# Patient Record
Sex: Female | Born: 1990 | Race: White | Hispanic: Yes | Marital: Single
Health system: Midwestern US, Community
[De-identification: ages and names within clinical notes are randomized; demographics above are authoritative.]

## PROBLEM LIST (undated history)

## (undated) DIAGNOSIS — T7840XA Allergy, unspecified, initial encounter: Secondary | ICD-10-CM

## (undated) DIAGNOSIS — E282 Polycystic ovarian syndrome: Secondary | ICD-10-CM

## (undated) DIAGNOSIS — R0781 Pleurodynia: Secondary | ICD-10-CM

## (undated) HISTORY — PX: WISDOM TOOTH EXTRACTION: SHX21

## (undated) HISTORY — DX: Allergy, unspecified, initial encounter: T78.40XA

---
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
## 2012-04-08 IMAGING — CT CT HEAD WITHOUT CONTRAST
2 series · 16 of 30 positions shown, 20 images · non-contrast
Comparison: none

REASON FOR EXAM: head injury, feeling in a "fog"
COMMENTS:

[Series 2: without · axial · non-contrast · 0.45mm/px · z∈[-178,-52]mm · 13 of 31 slices shown, 17 images]
[im 3/31  brain]
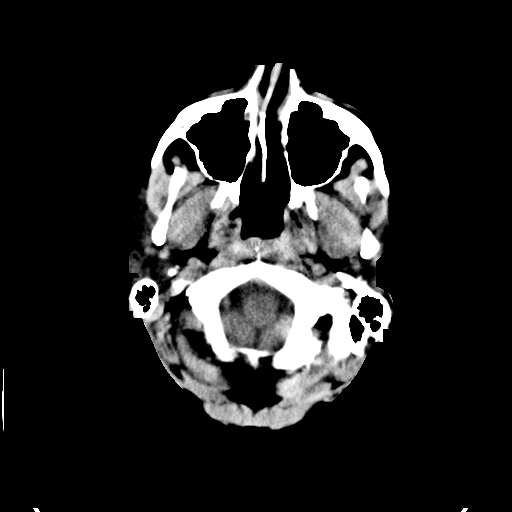
[im 3/31  bone]
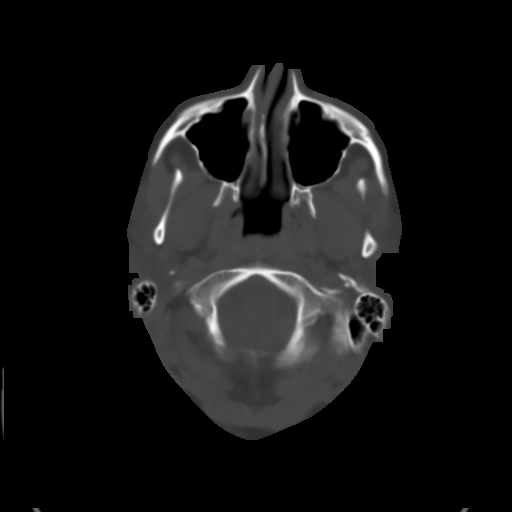
[im 5/31  brain]
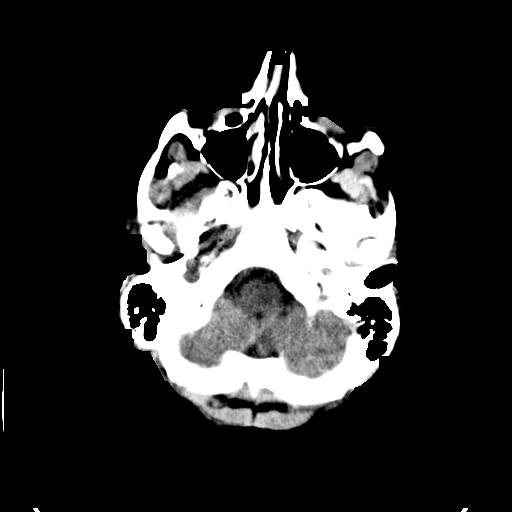
[im 7/31  brain]
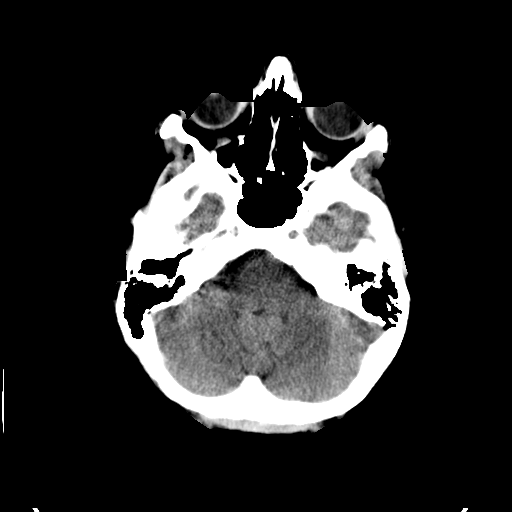
[im 9/31  brain]
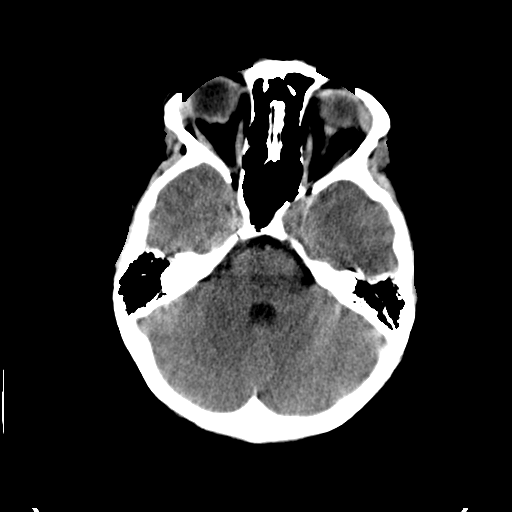
[im 11/31  brain]
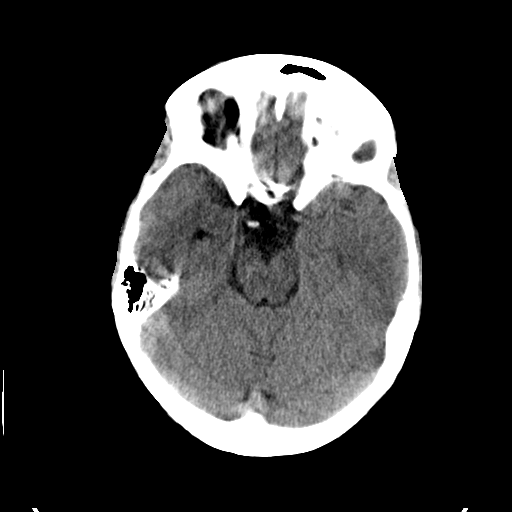
[im 11/31  bone]
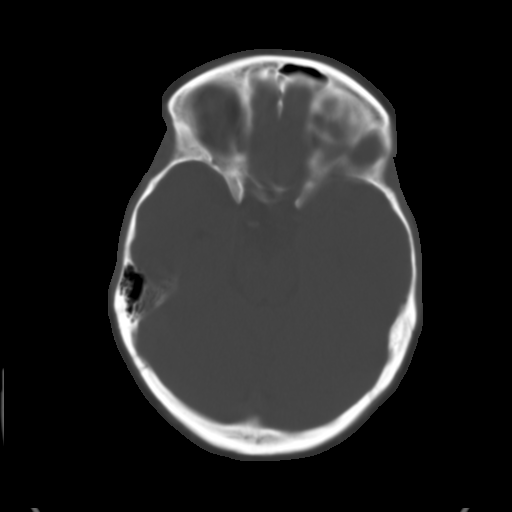
[im 13/31  brain]
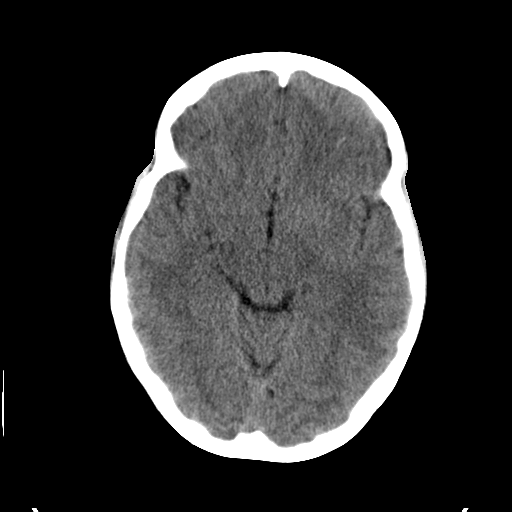
[im 16/31  brain]
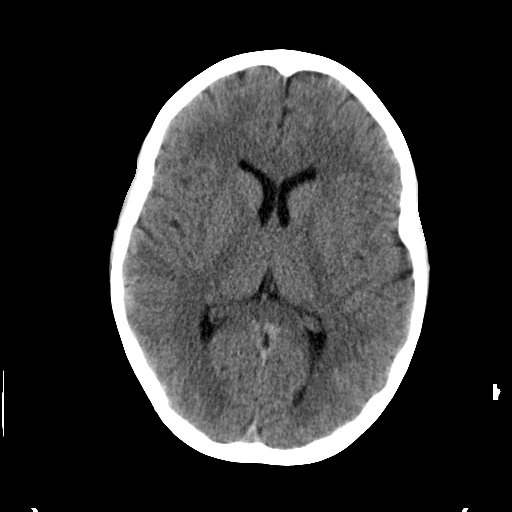
[im 18/31  brain]
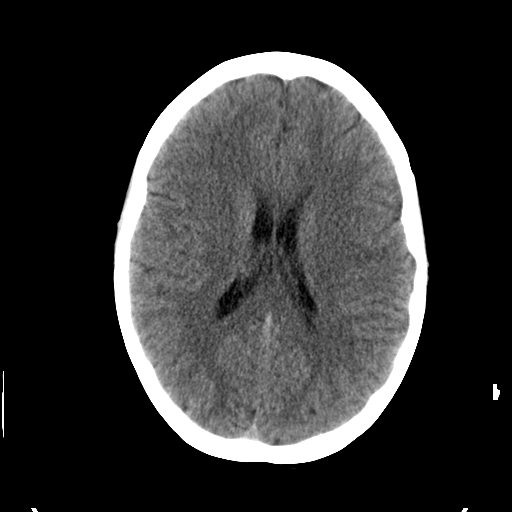
[im 20/31  brain]
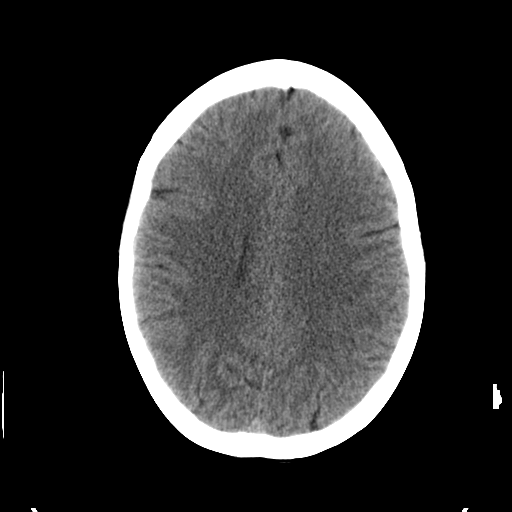
[im 20/31  bone]
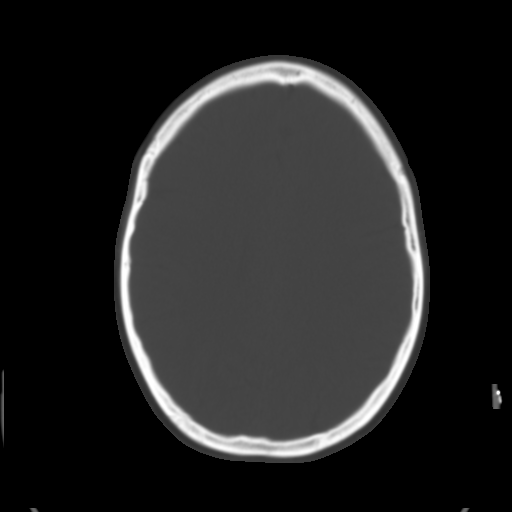
[im 22/31  brain]
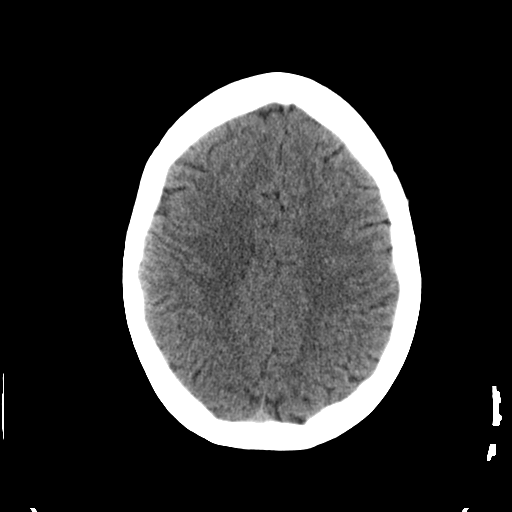
[im 24/31  brain]
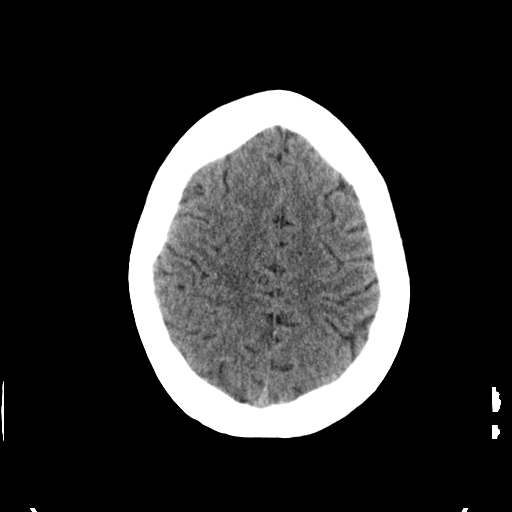
[im 26/31  brain]
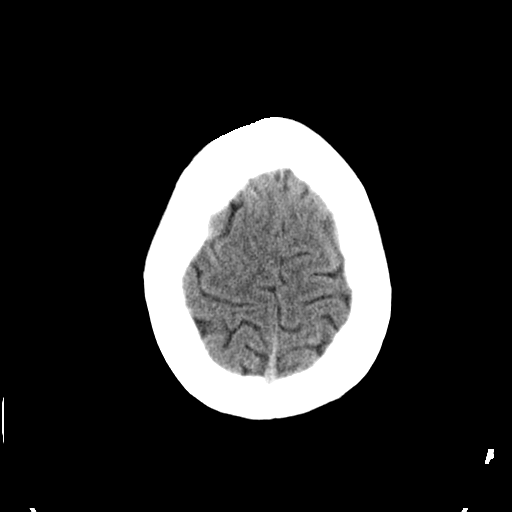
[im 28/31  brain]
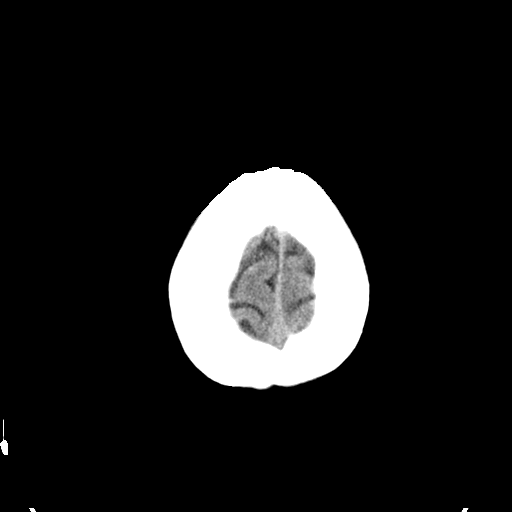
[im 28/31  bone]
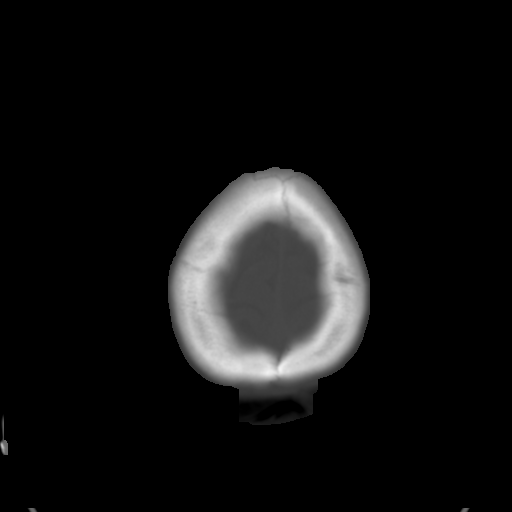

[Series 3: bone · axial · 0.45mm/px · z∈[-178,-138]mm · 3 of 31 slices shown]
[im 3/31  bone]
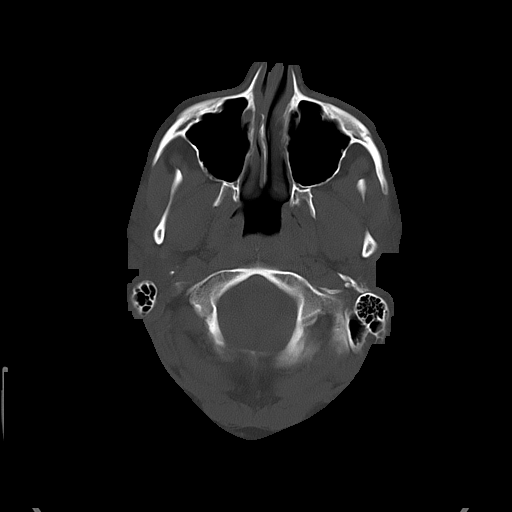
[im 7/31  bone]
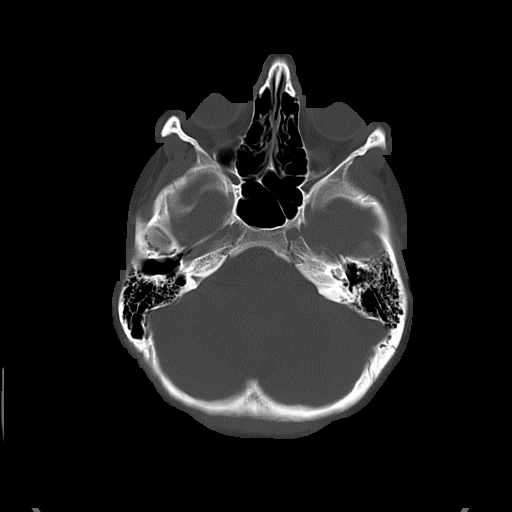
[im 11/31  bone]
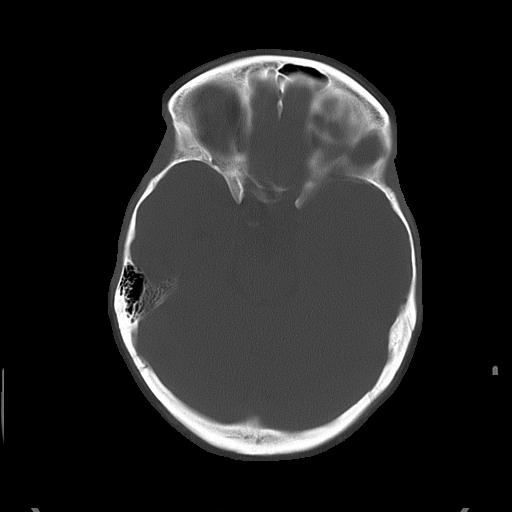

[16 of 30 positions shown; findings below may reference images not displayed]

PROCEDURE:     CT  - CT HEAD WITHOUT CONTRAST  - October 17, 2011 [DATE]

RESULT:     Emergent noncontrast CT of the brain is performed in the
standard fashion. There is no previous exam for comparison.

The ventricles and sulci are normal. There is no hemorrhage. There is no
focal mass, mass-effect or midline shift. There is no evidence of edema or
territorial infarct. The bone windows demonstrate normal aeration of the
paranasal sinuses and mastoid air cells. There is no skull fracture
demonstrated.
IMPRESSION: 1. No acute intracranial abnormality.

## 2013-06-16 LAB — EKG, 12 LEAD, INITIAL
Atrial Rate: 69 {beats}/min
Calculated P Axis: 71 degrees
Calculated R Axis: 77 degrees
Calculated T Axis: 44 degrees
Diagnosis: NORMAL
P-R Interval: 138 ms
Q-T Interval: 406 ms
QRS Duration: 90 ms
QTC Calculation (Bezet): 435 ms
Ventricular Rate: 69 {beats}/min

## 2013-06-16 NOTE — Other (Signed)
Patient is a 22 y.o. female presenting with chest pain. The history is provided by the patient.   Chest Pain   This is a new problem. The current episode started more than 2 days ago. The problem has not changed since onset.Episode frequency: intermittent- random. The pain is present in the left side (over Costochondral junction area). The pain is at a severity of 4/10. The pain is mild. The quality of the pain is described as dull. The pain does not radiate. Associated symptoms include shortness of breath (occasional). Pertinent negatives include no dizziness, no hemoptysis, no malaise/fatigue, no near-syncope and no PND. She has tried nothing for the symptoms. Risk factors include no risk factors (ecxcept physically very active- dance and exercise). Her past medical history does not include DVT or PE.        No past medical history on file.     Past Surgical History   Procedure Laterality Date   ??? Hx wisdom teeth extraction           No family history on file.     History     Social History   ??? Marital Status: SINGLE     Spouse Name: N/A     Number of Children: N/A   ??? Years of Education: N/A     Occupational History   ??? Not on file.     Social History Main Topics   ??? Smoking status: Not on file   ??? Smokeless tobacco: Not on file   ??? Alcohol Use:    ??? Drug Use:    ??? Sexually Active:      Other Topics Concern   ??? Not on file     Social History Narrative   ??? No narrative on file                ALLERGIES: Review of patient's allergies indicates no known allergies.    Filed Vitals:    06/16/13 1420   BP: 119/79   Pulse: 59   Temp: 98.4 ??F (36.9 ??C)   Resp: 18   Height: 5\' 7"  (1.702 m)   Weight: 71.215 kg (157 lb)   SpO2: 99%       Physical Exam   Nursing note and vitals reviewed.  Constitutional: No distress.   HENT:   Nose: Nose normal.   Mouth/Throat: Oropharynx is clear and moist. No oropharyngeal exudate.   Eyes: Conjunctivae and EOM are normal. Pupils are equal, round, and reactive to light. No scleral icterus.    Neck: Neck supple. No JVD present. No thyromegaly present.   Cardiovascular: Normal rate, regular rhythm, normal heart sounds and intact distal pulses.    No murmur heard.  Pulmonary/Chest: Effort normal and breath sounds normal. No respiratory distress. She has no wheezes. She has no rales. She exhibits no tenderness.   Skin: No rash noted.       MDM    Procedures

## 2013-11-21 ENCOUNTER — Ambulatory Visit: Payer: Self-pay | Admitting: Internal Medicine

## 2013-12-19 ENCOUNTER — Ambulatory Visit: Payer: Self-pay | Admitting: Internal Medicine

## 2013-12-26 ENCOUNTER — Ambulatory Visit (INDEPENDENT_AMBULATORY_CARE_PROVIDER_SITE_OTHER): Payer: BC Managed Care – PPO | Admitting: Internal Medicine

## 2013-12-26 ENCOUNTER — Encounter: Payer: Self-pay | Admitting: Internal Medicine

## 2013-12-26 VITALS — BP 106/64 | HR 75 | Temp 98.2°F | Resp 12 | Ht 67.0 in | Wt 161.0 lb

## 2013-12-26 DIAGNOSIS — E282 Polycystic ovarian syndrome: Secondary | ICD-10-CM

## 2013-12-26 NOTE — Progress Notes (Addendum)
Patient ID: Brittney Turner, female   DOB: 12-Feb-1991, 23 y.o.   MRN: [id number]  HPI: Brittney Turner is a 23 y.o. female, referred by Dr. Page Spirohristie Farrell, NP, for evaluation for PCOS. PCP: Dr Mare FerrariPatricia Ryan [facility], [place], [place] She is here with her mother, who offers part of the history.  For last 2-3 years >> weight gain: 30 lbs since starting college despite increased exercise.  She saw ObGyn and had labs for PCOS in 08/2013 (no records) >> was told she had PCOS >> she then saw another ObGyn for a second opinion Page Spiro(Christie Farrell, NP) >> they reviewed  labs and told here that they were not c/w PCOS. The labs in 08/2013 were checked after being off OCPs for 7 mo. Since the, she restarted them (see below).  She had a significant concussion 3 years ago >> could not dance and exercise for a whole summer. Her mother feels that this was when the weight gain started.  Weight gain: - since starting college - no steroid use - no weight loss meds - Meals: - Breakfast: cereal + yoghurt + fruit, sometimes bagel - Lunch: sandwich: PB or chicken/turkey - Dinner: chicken + salad + pasta - Snacks: 1-2x a day; granola bars  Drinks: none; drinks coffee - 2-3 cups a day - Diets tried: no  She was on OCPs up to 2014, when she stopped them as she could not lose the weight no cycle for 7 mo (on Koriva) >> restarted OCPs in 08/2013 ([place]). 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[employer]; also runs - trained for [employer] >> not running as much >> only 2x a week 2-3 miles Minimum amount of dance every day: 3 hours  Fertility/Menstrual cycles: - menarche at 23 y/o - irregular menses from menarche, then became heavier >> started OCPs at 23 y/o - no h/o ovarian cysts - children: 0 - miscarriages: 0 - contraception: on OCPs  - [place]; not sexually active  Acne: - worsened off the OCPs >> it has not improved after restarted  Hirsutism: - a little on upper  lip  Treatments tried: - did not try Metformin - did not try Spironolactone - did not try Vaniqua - on OCPs  Other meds: - SSRIs: no  Other medical pbs: - none  - Last thyroid tests: TSH 1.260 on 09/15/2013.  ROS: Constitutional: + weight gain, + fatigue, no subjective hyperthermia/hypothermia Eyes: no blurry vision, no xerophthalmia ENT: no sore throat, no nodules palpated in throat, no dysphagia/odynophagia, no hoarseness Cardiovascular: no CP/SOB/palpitations/leg swelling Respiratory: no cough/SOB Gastrointestinal: no N/V/D/C Musculoskeletal: no muscle/joint aches Skin: + acne, + hair on face Neurological: no tremors/numbness/tingling/dizziness Psychiatric: no depression/anxiety + irreg. Menstrual cycles  Past Medical History  Diagnosis Date  . Allergy     SEASONAL   Past Surgical History  Procedure Laterality Date  . Wisdom tooth extraction  2012   History   Social History  . Marital Status: Single    Spouse Name: N/A    Number of Children: 0   Occupational History  . Dance student   Social History Main Topics  . Smoking status: Never Smoker   . Smokeless tobacco: No  . Alcohol Use: No  . Drug Use: No   Current Outpatient Rx  Name  Route  Sig  Dispense  Refill  . desloratadine (CLARINEX) 5 MG tablet   Oral   Take 5 mg by mouth daily.         . mometasone (NASONEX)  50 MCG/ACT nasal spray   Nasal   Place 2 sprays into the nose daily.          [facility] OCP  NKDA  FH: - DM in grandmother - no FH of HTN, HL, thyroid ds.  PE: BP 106/64  Pulse 75  Temp(Src) 98.2 F (36.8 C) (Oral)  Resp 12  Ht 5\' 7"  (1.702 m)  Wt 161 lb (73.029 kg)  BMI 25.21 kg/m2  SpO2 98% Wt Readings from Last 3 Encounters:  12/26/13 161 lb (73.029 kg)   Constitutional: overweight, in NAD, no full supraclavicular fat pads Eyes: PERRLA, EOMI, no exophthalmos ENT: moist mucous membranes, no thyromegaly, no cervical lymphadenopathy Cardiovascular: RRR, No  MRG Respiratory: CTA B Gastrointestinal: abdomen soft, NT, ND, BS+ Musculoskeletal: no deformities, strength intact in all 4 Skin: moist, warm; + minimum acne on face, no dark terminal hair on face, + some vellum on sideburns, no skin tags Neurological: no tremor with outstretched hands, DTR normal in all 4  ASSESSMENT: 1. ?PCOS  PLAN: 1.  I had a long discussion with the patient and her mother about the fact that the PCOS is a misnomer, a patient does not necessarily have to have polycystic ovaries to be diagnosed with the disorder. This is of sum of several conditions, including:  weight gain  insulin resistance (and therefore a higher risk of developing diabetes later in life)  acne  hirsutism  irregular menstrual cycles  decreased fertility. - We also discussed about the fact that the treatment is usually targeted to addressing the problem that concerns the patient the most: acne/hirsutism, weight gain, or fertility, but there is no single treatment for PCOS.  - The first-line therapy are oral contraceptives. If she is concerned with her weight, we can use metformin; if she is concerned about acne/hirsutism, we can add spironolactone; and if she is concerned about fertility, I could refer her to reproductive endocrinology for possible use of clomiphene. - She does have several features of PCOS: irregular menstrual pbs, weight gain, acne. 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 At this point, since she started college, PCOS appears to predominate. - I will try to obtain labs from previous ObGyn (the labs drawn with pt off OCPs in 08/2013 >> if these are inconclusive for PCOS >> will stop OCPs and have her back for labs in 2 mo:  Hba1c  LH  FSH  Estradiol  Testosterone  Androstenedione  17 HO progesterone  TSH, free T4  and free T3 -  If the labs confirm PCOS >> start Metformin and likely also OCPs >> would like to switch to Yaz since has more anti-androgenic activity. She does not have personal or FH of DVT or clotting disorders or breast cancer. - in the meantime, we discussed about reducing calories from her diet. Given specific examples of healthy choices for her meals and also some plant-based materials (see Pt instructions). - I will see her back in 6 mo but will stay in touch through MyChart  Component     Latest Ref Rng 05/01/2014  Testosterone     10 - 70 ng/dL 58  Sex Hormone Binding     18 - 114 nmol/L 95  Testosterone Free     0.6 - 6.8 pg/mL 5.0  Testosterone-% Free     0.4 - 2.4 % 0.9  Estradiol, Free      0.18  Estradiol      12  Results received      05/08/14  Hemoglobin A1C     4.6 - 6.5 % 5.6  LH      3.56  FSH      6.2  17-OH-Progesterone, LC/MS/MS      30  DHEA-SO4     51 - 321 ug/dL 161 (H)  Androstenedione      118  TSH     0.35 - 4.50 uIU/mL 0.33 (L)  Free T4     0.60 - 1.60 ng/dL 0.96  T3, Free     2.3 - 4.2 pg/mL 2.7   Received records from [place]: 09/16/2013: TSH 1.260 Labs from previous ObGyn (Dr Arie Sabina) not sent.  [facility] [place] (Dr. Hilma Favors): 01/24/2012: patient was on Garnette Scheuermann, which was refilled. 01/24/2013: patient desired a new OCP due to increased weight of 10 pounds since starting [place] 2 years prior. This was despite being very active, and dancing. Patient  was prescribed Yaz. 08/14/2013: stopped taking Yaz in May 2014, no period since then. OB/GYN exam normal. At that point, patient was weighing 159.2 pounds, BMI 25.31.  LH was 9.5, LH was 7.8 (off OCP for 7 months). Testosterone was not checked.  Labs inconclusive for PCOS >> will stop OCPs and have her back for labs in 2 mo.   Component     Latest Ref Rng 05/19/2014  Prolactin      14.3  Somatomedin (IGF-I)     89 - 478  ng/mL 142  Cortisol, Plasma      21.8  C206 ACTH     6 - 50 pg/mL 37  DHEA-SO4     51 - 321 ug/dL 045 (H)  Pituitary labs normal. DHEAS improved.   I will consider this mild PCOS >> we can try Metformin.

## 2013-12-26 NOTE — Patient Instructions (Signed)
I will let you know if we need to check PCOS labs after I receive the records.  Please consider the following ways to cut down carbs and fat and increase fiber and micronutrients in your diet:  - substitute whole grain for white bread or pasta - substitute brown rice for white rice - substitute 90-calorie flat bread pieces for slices of bread when possible - substitute sweet potatoes or yams for white potatoes - substitute humus for margarine - substitute tofu for cheese when possible - substitute almond or rice milk for regular milk (would not drink soy milk daily due to concern for soy estrogen influence on breast cancer risk) - substitute dark chocolate for other sweets when possible - substitute water - can add lemon or orange slices for taste - for diet sodas (artificial sweeteners will trick your body that you can eat sweets without getting calories and will lead you to overeating and weight gain in the long run) - do not skip breakfast or other meals (this will slow down the metabolism and will result in more weight gain over time)  - can try smoothies made from fruit and almond/rice milk in am instead of regular breakfast - can also try old-fashioned (not instant) oatmeal made with almond/rice milk in am - order the dressing on the side when eating salad at a restaurant (pour less than half of the dressing on the salad) - eat as little meat as possible - can try juicing, but should not forget that juicing will get rid of the fiber, so would alternate with eating raw veg./fruits or drinking smoothies - use as little oil as possible, even when using olive oil - can dress a salad with a mix of balsamic vinegar and lemon juice, for e.g. - use agave nectar, stevia sugar, or regular sugar rather than artificial sweateners - steam or broil/roast veggies  - snack on veggies/fruit/nuts (unsalted, preferably) when possible, rather than processed foods - reduce or eliminate aspartame in diet (it  is in diet sodas, chewing gum, etc) Read the labels!  Try to read Dr. Katherina RightNeal Barnard's book: "Program for Reversing Diabetes" for the vegan concept and other ideas for healthy eating.  Plant-based diet materials: - Lectures (you tube):  Lequita AsalNeal Barnard: "Breaking the Food Seduction"  Doug Lisle: "How to Lose Weight, without Losing Your Mind"  Lucile CraterRodney Snow: "What is Insulin Resistance" TucsonEntrepreneur.sihttp://www.youtube.com/watch?v=k5iM6A67z3Y - Documentaries:  Supersize Me  Food Inc.  Forks over BorgWarnerKnives  Vegucated  Fat, Sick and Nearly Dead  The Edison InternationalWeight of the Nationwide Mutual Insuranceation - Books:  Lequita AsalNeal Barnard: "Program for Reversing Diabetes"  Ferol LuzColin Campbell: "The Armeniahina Study"  Konrad PentaDonna Klein: "Supermarket Vegan" (cookbook) - Facebook pages:   Reece AgarForks versus Knives  Vegucated  Toys ''R'' UsVegNews Magazine  Food Matters - Healthy nutrition info websites:  LateTelevision.com.eehttp://nutritionfacts.org/

## 2014-01-02 ENCOUNTER — Encounter: Payer: Self-pay | Admitting: Internal Medicine

## 2014-01-15 ENCOUNTER — Encounter: Payer: Self-pay | Admitting: Internal Medicine

## 2014-01-15 DIAGNOSIS — E282 Polycystic ovarian syndrome: Secondary | ICD-10-CM | POA: Insufficient documentation

## 2014-04-28 ENCOUNTER — Other Ambulatory Visit: Payer: BC Managed Care – PPO

## 2014-05-01 ENCOUNTER — Other Ambulatory Visit (INDEPENDENT_AMBULATORY_CARE_PROVIDER_SITE_OTHER): Payer: BC Managed Care – PPO

## 2014-05-01 DIAGNOSIS — E282 Polycystic ovarian syndrome: Secondary | ICD-10-CM

## 2014-05-01 LAB — T3, FREE: T3, Free: 2.7 pg/mL (ref 2.3–4.2)

## 2014-05-01 LAB — TSH: TSH: 0.33 u[IU]/mL — ABNORMAL LOW (ref 0.35–4.50)

## 2014-05-01 LAB — FOLLICLE STIMULATING HORMONE: FSH: 6.2 m[IU]/mL

## 2014-05-01 LAB — LUTEINIZING HORMONE: LH: 3.56 m[IU]/mL

## 2014-05-01 LAB — HEMOGLOBIN A1C: HEMOGLOBIN A1C: 5.6 % (ref 4.6–6.5)

## 2014-05-01 LAB — T4, FREE: FREE T4: 0.92 ng/dL (ref 0.60–1.60)

## 2014-05-04 LAB — TESTOSTERONE, FREE, TOTAL, SHBG
Sex Hormone Binding: 95 nmol/L (ref 18–114)
TESTOSTERONE FREE: 5 pg/mL (ref 0.6–6.8)
Testosterone-% Free: 0.9 % (ref 0.4–2.4)
Testosterone: 58 ng/dL (ref 10–70)

## 2014-05-04 LAB — DHEA-SULFATE: DHEA SO4: 491 ug/dL — AB (ref 51–321)

## 2014-05-05 LAB — 17-HYDROXYPROGESTERONE: 17-OH-PROGESTERONE, LC/MS/MS: 30 ng/dL

## 2014-05-06 LAB — ANDROSTENEDIONE: ANDROSTENEDIONE: 118 ng/dL

## 2014-05-08 LAB — ESTRADIOL, FREE
ESTRADIOL FREE: 0.18 pg/mL
Estradiol: 12 pg/mL

## 2014-05-13 ENCOUNTER — Other Ambulatory Visit: Payer: Self-pay | Admitting: Internal Medicine

## 2014-05-13 DIAGNOSIS — N926 Irregular menstruation, unspecified: Secondary | ICD-10-CM

## 2014-05-17 ENCOUNTER — Encounter: Payer: Self-pay | Admitting: Internal Medicine

## 2014-05-19 ENCOUNTER — Other Ambulatory Visit (INDEPENDENT_AMBULATORY_CARE_PROVIDER_SITE_OTHER): Payer: BC Managed Care – PPO

## 2014-05-19 DIAGNOSIS — N926 Irregular menstruation, unspecified: Secondary | ICD-10-CM

## 2014-05-19 LAB — CORTISOL: CORTISOL PLASMA: 21.8 ug/dL

## 2014-05-20 LAB — DHEA-SULFATE: DHEA SO4: 443 ug/dL — AB (ref 51–321)

## 2014-05-20 LAB — INSULIN-LIKE GROWTH FACTOR: Somatomedin (IGF-I): 142 ng/mL (ref 89–478)

## 2014-05-20 LAB — PROLACTIN: Prolactin: 14.3 ng/mL

## 2014-05-22 LAB — ACTH: C206 ACTH: 37 pg/mL (ref 6–50)

## 2014-05-26 ENCOUNTER — Ambulatory Visit: Payer: BC Managed Care – PPO | Admitting: Internal Medicine

## 2014-06-02 ENCOUNTER — Encounter: Payer: Self-pay | Admitting: Internal Medicine

## 2014-06-02 ENCOUNTER — Other Ambulatory Visit: Payer: Self-pay | Admitting: Internal Medicine

## 2014-06-02 MED ORDER — METFORMIN HCL 500 MG PO TABS: 500.0000 mg | ORAL_TABLET | Freq: Two times a day (BID) | ORAL | Status: DC

## 2014-06-26 ENCOUNTER — Other Ambulatory Visit (INDEPENDENT_AMBULATORY_CARE_PROVIDER_SITE_OTHER): Payer: BC Managed Care – PPO | Admitting: *Deleted

## 2014-06-26 ENCOUNTER — Encounter: Payer: Self-pay | Admitting: Internal Medicine

## 2014-06-26 ENCOUNTER — Ambulatory Visit (INDEPENDENT_AMBULATORY_CARE_PROVIDER_SITE_OTHER): Payer: BC Managed Care – PPO | Admitting: Internal Medicine

## 2014-06-26 VITALS — BP 104/62 | HR 80 | Temp 98.7°F | Resp 12 | Wt 153.0 lb

## 2014-06-26 DIAGNOSIS — Z23 Encounter for immunization: Secondary | ICD-10-CM

## 2014-06-26 DIAGNOSIS — E282 Polycystic ovarian syndrome: Secondary | ICD-10-CM

## 2014-06-26 MED ORDER — METFORMIN HCL 1000 MG PO TABS: 1000.0000 mg | ORAL_TABLET | Freq: Two times a day (BID) | ORAL | Status: DC

## 2014-06-26 NOTE — Progress Notes (Signed)
Patient ID: Brittney HumbleCaroline Diggs, female   DOB: 1991/06/01, 23 y.o.   MRN: [id number]  HPI: Brittney Turner is a [age] y.o. female, initially referred by Dr. Page Spirohristie Farrell, NP, now returning for f/u for mild PCOS dx at last visit. Last visit 6 mo ago. PCP: Dr Mare FerrariPatricia Ryan [facility], [place], [place]).  Reviewed and annotated hx: For last 2-3 years >> weight gain: 30 lbs since starting college despite increased exercise.  She saw ObGyn and had labs for PCOS in 08/2013 >> was told she had PCOS >> she then saw another ObGyn for a second opinion Page Spiro(Christie Farrell, NP) >> they reviewed  labs and told here that they were not c/w PCOS. The labs in 08/2013 were checked after being off OCPs for 7 mo.   Weight gain: - since starting college - no steroid use - no weight loss meds - Diets tried: now on lactose free diet  She was on OCPs up to 2014, when she stopped them as she could not lose the weight no cycle for 7 mo (on Koriva) >> restarted OCPs in 08/2013 ([place]). 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 Her mother feels that this was when the weight gain started.  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 Her labs were normal on oral contraceptives, so we stopped these for approximately 2 months and then repeated the labs. She probably has mild PCOS. Her DHEAS was still high, but improved at last check.  We started Metformin 1000 mg 2x a day 06/02/2014. We did not restart oral contraceptives yet, this may be an option in the future if she does not have 4 menstrual cycles a year at least.  Pt lost 8 lbs since last visit. She continues dancing and thinking about starting PA school at [employer]. She still has irregular menses >> 2 cycles since last visit. Acne worse since back at school.   At last visit, patient had a slightly low TSH, with normal free T4 and free T3. She is asymptomatic.  ROS: Constitutional: + weight loss, no fatigue, no subjective hyperthermia/hypothermia Eyes: no blurry vision, no xerophthalmia ENT: no sore throat, no nodules palpated in throat, no dysphagia/odynophagia, no hoarseness Cardiovascular: no CP/SOB/palpitations/leg swelling Respiratory: no cough/SOB Gastrointestinal: no N/V/D/C Musculoskeletal: no muscle/joint aches Skin: + acne, + hair on face Neurological: no tremors/numbness/tingling/dizziness Psychiatric: no depression/anxiety + irreg. Menstrual cycles  PE: BP 104/62  Pulse 80  Temp(Src) 98.7 F (37.1 C) (Oral)  Resp 12  Wt 153 lb (69.4 kg)  SpO2 95% Body mass index is 23.96 kg/(m^2). Wt Readings from Last 3 Encounters:  06/26/14 153 lb (69.4 kg)  12/26/13 161 lb (73.029 kg)   Constitutional: normal weight, fit, in NAD, no full supraclavicular fat pads Eyes: PERRLA, EOMI, no exophthalmos ENT: moist mucous membranes, no thyromegaly, no cervical lymphadenopathy Cardiovascular: RRR, No MRG Respiratory: CTA B Gastrointestinal: abdomen soft, NT, ND, BS+ Musculoskeletal: no deformities, strength intact in all 4 Skin: moist, warm; + minimum acne on face, no dark terminal hair on face, + some vellum on sideburns, no skin  tags Neurological: no tremor with outstretched hands, DTR normal in all 4  ASSESSMENT: 1. Mild PCOS  Component     Latest Ref Rng 05/01/2014  Testosterone     10 - 70 ng/dL 58  Sex Hormone Binding     18 - 114 nmol/L 95  Testosterone Free     0.6 - 6.8 pg/mL 5.0  Testosterone-% Free     0.4 - 2.4 % 0.9  Estradiol, Free      0.18  Estradiol      12  Results received      05/08/14  Hemoglobin A1C     4.6 - 6.5 % 5.6  LH      3.56  FSH      6.2  17-OH-Progesterone, LC/MS/MS      30  DHEA-SO4     51 - 321 ug/dL 161491 (H)  Androstenedione      118  TSH     0.35 - 4.50 uIU/mL 0.33 (L)  Free T4     0.60 - 1.60 ng/dL 0.960.92  T3, Free     2.3 - 4.2 pg/mL 2.7   [facility]: 09/16/2013: TSH 1.260 Labs from previous ObGyn (Dr Arie SabinaBlanton) not sent.  [facility] records [facility] [place] (Dr. Hilma FavorsErika Blanton): 01/24/2012: patient was on Garnette ScheuermannKariva, which was refilled. 01/24/2013: patient desired a new OCP due to increased weight of 10 pounds since starting [place] 2 years prior. This was despite being very active, and dancing. Patient  was prescribed Yaz. 08/14/2013: stopped taking Yaz in May 2014, no period since then. OB/GYN exam normal. At that point, patient was weighing 159.2 pounds, BMI 25.31.  LH was 9.5, LH was 7.8 (off OCP for 7 months). Testosterone was not checked.  Labs inconclusive for PCOS >> we stopped OCPs and repeated labs in 2 mo:   Component     Latest Ref Rng 05/19/2014  Prolactin      14.3  Somatomedin (IGF-I)     89 - 478 ng/mL 142  Cortisol, Plasma      21.8  C206 ACTH     6 - 50 pg/mL 37  DHEA-SO4     51 - 321 ug/dL [id number] (H)  Pituitary labs normal. DHEAS improved.   We considered this mild PCOS >> stared Metformin.  2. Low TSH  PLAN: 1.  I reviewed patient's previous labs along with her, explained that she has mild PCOS. She does have several features of PCOS: irregular menstrual pbs, weight gain,  acne. 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  - she is doing well on metformin, it is too early to have noticed an effect, and actually I would like to wait another month before rechecking her testosterone - I also advised her to monitor her menses and if she does not have more than 2 in 6 months, we would need to start OCPs >> would like to  switch to Yaz since has more anti-androgenic activity. She does not have personal or FH of DVT or clotting disorders or breast cancer. - she continues with a lactose-free diet, her weight improved tremendously on this. I do not believe that she needs to lose anymore weight - I will see her back in 6 mo but she will return for labs in another month  2. Low TSH  - normal free T4 and free T3 - We'll repeat her thyroid tests when she comes back in a month - asymptomatic

## 2014-06-26 NOTE — Patient Instructions (Signed)
Continue Metformin 1000 mg 2x a day. Please come back for labs in ~1 month. Please come back for a follow-up appointment in 6 months.

## 2014-08-04 ENCOUNTER — Other Ambulatory Visit (INDEPENDENT_AMBULATORY_CARE_PROVIDER_SITE_OTHER): Payer: BC Managed Care – PPO

## 2014-08-04 ENCOUNTER — Other Ambulatory Visit: Payer: BC Managed Care – PPO

## 2014-08-04 DIAGNOSIS — E282 Polycystic ovarian syndrome: Secondary | ICD-10-CM

## 2014-08-04 LAB — T3, FREE: T3, Free: 2.9 pg/mL (ref 2.3–4.2)

## 2014-08-04 LAB — T4, FREE: Free T4: 0.79 ng/dL (ref 0.60–1.60)

## 2014-08-04 LAB — TSH: TSH: 2.82 u[IU]/mL (ref 0.35–4.50)

## 2014-08-05 LAB — TESTOSTERONE, FREE, TOTAL, SHBG
Sex Hormone Binding: 84 nmol/L (ref 18–114)
TESTOSTERONE-% FREE: 0.9 % (ref 0.4–2.4)
TESTOSTERONE: 64 ng/dL (ref 10–70)
Testosterone, Free: 6 pg/mL (ref 0.6–6.8)

## 2014-08-05 LAB — DHEA-SULFATE: DHEA-SO4: 417 ug/dL — ABNORMAL HIGH (ref 51–321)

## 2014-12-18 ENCOUNTER — Other Ambulatory Visit: Payer: Self-pay | Admitting: Internal Medicine

## 2014-12-25 ENCOUNTER — Ambulatory Visit: Payer: BC Managed Care – PPO | Admitting: Internal Medicine

## 2015-01-07 ENCOUNTER — Ambulatory Visit (INDEPENDENT_AMBULATORY_CARE_PROVIDER_SITE_OTHER): Payer: BLUE CROSS/BLUE SHIELD | Admitting: Internal Medicine

## 2015-01-07 ENCOUNTER — Encounter: Payer: Self-pay | Admitting: Internal Medicine

## 2015-01-07 ENCOUNTER — Other Ambulatory Visit: Payer: Self-pay | Admitting: *Deleted

## 2015-01-07 DIAGNOSIS — E282 Polycystic ovarian syndrome: Secondary | ICD-10-CM

## 2015-01-07 NOTE — Patient Instructions (Signed)
Please continue Metformin 1000 mg 2x a day.  Please come back for a follow-up appointment in 6 months.  Good luck tomorrow!!!!

## 2015-01-07 NOTE — Progress Notes (Signed)
Patient ID: Brittney Turner, female   DOB: [birth year], 24 y.o.   MRN: [id number]  HPI: Brittney Turner is a 24 y.o. female, initially referred by Brittney. Brittney Spirohristie Farrell, NP, now returning for f/u for mild PCOS and high DHEAS. Last visit 7 mo ago. PCP: Brittney Turner([facility], [place], [place]).  Reviewed and annotated hx: For last 2-3 years >> weight gain: 30 lbs since starting college despite increased exercise.  She saw ObGyn and had labs in 08/2013 >> was told she had PCOS >> she then saw another [facility] for a second opinion [facility]ney [facility](Brittney Farrell, NP) >> they reviewed  labs and told here that they were not c/w PCOS. The labs in 08/2013 were checked after being off OCPs for 7 mo.   Weight gain: - since starting college  - no steroid use - no weight loss meds - Diets tried: now on lactose free diet >> lost 10 lbs in last year  She was on OCPs up to 2014, when she stopped them as she could not lose the weight no cycle for 7 mo (on Koriva) >> restarted OCPs in 08/2013 ([facility]). 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 Minimum amount of dance every day: 3 hours. Max: 6h  She had a significant concussion in 2012 >> could not dance and exercise for a whole summer. Her mother feels that this was when the weight gain started.  Fertility/Menstrual cycles: - menarche at 24 y/o - irregular menses from menarche, then became heavier >> started OCPs at 24 y/o - no h/o ovarian cysts - children: 0 - miscarriages: 0 - contraception: was on OCPs  - [facility]  Acne: - worsened off the OCPs, then improved   Hirsutism: - a little on upper lip  Treatments tried: - did not try Spironolactone - did not try [place] - was on OCPs, not currently - on Metformin  Other meds: - SSRIs: no  Other medical pbs: - none  At last visit, we performed the necessary investigation for PCOS and also for possible hypopituitarism after her  concussion. Her labs were normal on oral contraceptives, so we stopped these for approximately 2 months and then repeated the labs. She likely has mild PCOS. Her DHEAS was still high, but improved at last check:  Component     Latest Ref Rng 05/01/2014 05/19/2014 08/04/2014  Testosterone     10 - 70 ng/dL 58  64  Sex Hormone Binding     18 - 114 nmol/L 95  84  Testosterone Free     0.6 - 6.8 pg/mL 5.0  6.0  Testosterone-% Free     0.4 - 2.4 % 0.9  0.9  Estradiol, Free      0.18    Estradiol      12    Results received      05/08/14    LH      3.56    FSH      6.2    17-OH-Progesterone, LC/MS/MS      30    DHEA-SO4     51 - 321 ug/dL 409491 (H) 811443 (H) 914417 (H)  Androstenedione      118    Prolactin       14.3   Somatomedin (IGF-I)     89 - 478 ng/mL  142   Cortisol, Plasma       21.8   C206 ACTH     6 - 50 pg/mL  37    We started Metformin 1000 mg 2x a day 06/02/2014. She is tolerating this well, but has a "sensitive stomach".  We did not restart oral contraceptives yet to see how her cycles may react to Metformin >> indeed, since last visit, she had monthly menstrual cycles!  Pt lost 10 lbs in last year. 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  Last summer patient had a slightly low TSH, with normal free T4 and free T3 (asymptomatic). We repeated the labs in 08/2014: TFTs normalized:  Component     Latest Ref Rng 05/01/2014 08/04/2014  TSH     0.35 - 4.50 uIU/mL 0.33 (L) 2.82  Free T4     0.60 - 1.60 ng/dL 1.61 0.96  T3, Free     2.3 - 4.2 pg/mL 2.7 2.9   ROS: Constitutional: + weight loss, no fatigue, no subjective hyperthermia/hypothermia Eyes: no blurry vision, no xerophthalmia ENT: no sore throat, no nodules palpated in throat, no dysphagia/odynophagia, no hoarseness Cardiovascular: no CP/SOB/palpitations/leg swelling Respiratory: no cough/SOB Gastrointestinal: + N/+ V/no D/C/+ acid reflux Musculoskeletal: no  muscle/joint aches Skin: no acne, no hair on face Neurological: no tremors/numbness/tingling/dizziness   PE: BP 118/68 mmHg  Pulse 69  Temp(Src) 97.7 F (36.5 C) (Oral)  Resp 12  Wt 151 lb (68.493 kg)  SpO2 99% Body mass index is 23.64 kg/(m^2). Wt Readings from Last 3 Encounters:  01/07/15 151 lb (68.493 kg)  06/26/14 153 lb (69.4 kg)  12/26/13 161 lb (73.029 kg)   Constitutional: normal weight, fit, in NAD, no full supraclavicular fat pads Eyes: PERRLA, EOMI, no exophthalmos ENT: moist mucous membranes, no thyromegaly, no cervical lymphadenopathy Cardiovascular: RRR, No MRG Respiratory: CTA B Gastrointestinal: abdomen soft, NT, ND, BS+ Musculoskeletal: no deformities, strength intact in all 4 Skin: moist, warm; no acne on face, no dark terminal hair on face, + some vellum on sideburns, no skin tags Neurological: no tremor with outstretched hands, DTR normal in all 4  ASSESSMENT: 1. Mild PCOS  Records from [facility]: 09/16/2013: TSH 1.260 Labs from previous ObGyn (Brittney Brittney Turner) not sent.  Records from from [facility] (Brittney. [facility]ney Turner): 01/24/2012: patient was on [facility]ney [facility], which was refilled. 01/24/2013: patient desired a new OCP due to increased weight of 10 pounds since starting [place] 2 years prior. This was despite being very active, and dancing. Patient  was prescribed Yaz. 08/14/2013: stopped taking Yaz in May 2014, no period since then. OB/GYN exam normal. At that point, patient was weighing 159.2 pounds, BMI 25.31.  LH was 9.5, LH was 7.8 (off OCP for 7 months). Testosterone was not checked.  2. High DHEAS  3. Low TSH  PLAN: 1.  I reviewed patient's previous labs along with her, explained that she has mild PCOS. She does have several features of PCOS: irregular menstrual pbs, weight gain, acne. 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 Now that she has lost weight, controls her diet and stays active, her acne and menses are doing much better. Metformin likely contributes, also. No need for OCPs since menses are coming monthly. - continue Metformin 1000 mg bid - she continues with a lactose-free diet, her weight improved greatly on this.  - check testosterone, HbA1c - I will see her back in 6 mo   2. High DHEAS - this can appear in  connection with PCOS - decreasing - check DHEAS today  3. Low TSH  - normalized at last check - asymptomatic - will not repeat for now  Office Visit on 01/07/2015  Component Date Value Ref Range Status  . Hgb A1c MFr Bld 01/07/2015 5.5  4.6 - 6.5 % Final   Glycemic Control Guidelines for People with Diabetes:Non Diabetic:  <6%Goal of Therapy: <7%Additional Action Suggested:  >8%   . DHEA 01/07/2015 763  102 - 1185 ng/dL Final  . Testosterone 78/46/962905/01/2015 46  8 - 48 ng/dL Final  . Testosterone, Free 01/07/2015 2.5  0.0 - 4.2 pg/mL Final   Free testosterone normal. DHEA normal (this was ordered instead of DHEAS, but is reassuring that it is normal).  Hemoglobin A1c normal.

## 2015-01-08 LAB — HEMOGLOBIN A1C: Hgb A1c MFr Bld: 5.5 % (ref 4.6–6.5)

## 2015-01-09 LAB — TESTOSTERONE,FREE AND TOTAL
TESTOSTERONE FREE: 2.5 pg/mL (ref 0.0–4.2)
Testosterone: 46 ng/dL (ref 8–48)

## 2015-01-14 LAB — DHEA: DHEA: 763 ng/dL (ref 102–1185)

## 2015-07-12 ENCOUNTER — Other Ambulatory Visit: Payer: Self-pay | Admitting: Internal Medicine

## 2015-07-12 ENCOUNTER — Ambulatory Visit: Payer: Self-pay | Admitting: Internal Medicine

## 2015-08-06 ENCOUNTER — Encounter: Payer: Self-pay | Admitting: Internal Medicine

## 2015-08-06 ENCOUNTER — Ambulatory Visit (INDEPENDENT_AMBULATORY_CARE_PROVIDER_SITE_OTHER): Payer: BLUE CROSS/BLUE SHIELD | Admitting: Internal Medicine

## 2015-08-06 VITALS — BP 102/60 | HR 69 | Temp 98.0°F | Resp 12 | Wt 151.6 lb

## 2015-08-06 DIAGNOSIS — E282 Polycystic ovarian syndrome: Secondary | ICD-10-CM

## 2015-08-06 LAB — T3, FREE: T3, Free: 3 pg/mL (ref 2.3–4.2)

## 2015-08-06 LAB — TSH: TSH: 1.01 u[IU]/mL (ref 0.35–4.50)

## 2015-08-06 LAB — T4, FREE: FREE T4: 0.64 ng/dL (ref 0.60–1.60)

## 2015-08-06 LAB — HEMOGLOBIN A1C: Hgb A1c MFr Bld: 5.5 % (ref 4.6–6.5)

## 2015-08-06 NOTE — Progress Notes (Signed)
Patient ID: Brittney Turner, female   DOB: August 09, 1991, 24 y.o.   MRN: [id number]  HPI: Brittney Turner is a 24 y.o. female, initially referred by Dr. Page Spiro, NP, now returning for f/u for mild PCOS and high DHEAS. Last visit 6 mo ago. PCP: Dr Mare Ferrari [facility], [place], [place]).  Reviewed and annotated hx: For last 2-3 years >> weight gain: 30 lbs since starting college despite increased exercise.  She saw ObGyn and had labs in 08/2013 >> was told she had PCOS >> she then saw another Melrose Nakayama for a second opinion Page Spiro, NP) >> they reviewed  labs and told here that they were not c/w PCOS. The labs in 08/2013 were checked after being off OCPs for 7 mo.   Weight gain: - since starting college  - no steroid use - no weight loss meds - Diets tried: now on lactose free diet >> lost 10 lbs in last year  She was on OCPs up to 2014, when she stopped them as she could not lose the weight no cycle for 7 mo (on Koriva) >> restarted OCPs in 08/2013 ([facility]). 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 Minimum amount of dance every day: 3 hours. Max: 6h  She had a significant concussion in 2012 >> could not dance and exercise for a whole summer. Her mother feels that this was when the weight gain started.  Fertility/Menstrual cycles: - menarche at 24 y/o - irregular menses from menarche, then became heavier >> started OCPs at 24 y/o - no h/o ovarian cysts - children: 0 - miscarriages: 0 - contraception: was on OCPs  - [place]  Acne: - worsened off the OCPs, then improved   Hirsutism: - a little on upper lip  Treatments tried: - did not try Spironolactone - did not try [place] - was on OCPs, not currently - on Metformin  Other meds: - SSRIs: no  Other medical pbs: - none  At previous visits, we performed the necessary investigation for PCOS and also for possible hypopituitarism after her  concussion. Her labs were normal on oral contraceptives, so we stopped these for approximately 2 months and then repeated the labs. She likely has mild PCOS. Her DHEAS was still high, but a DHEA checked at last visit >> normal.  Component     Latest Ref Rng 05/01/2014 05/19/2014 08/04/2014 01/07/2015  Testosterone     8 - 48 ng/dL 58  64 46  Sex Hormone Binding     18 - 114 nmol/L 95  84   Testosterone Free     0.0 - 4.2 pg/mL 5.0  6.0 2.5  Testosterone-% Free     0.4 - 2.4 % 0.9  0.9   Estradiol, Free      0.18     Estradiol      12     Results received      05/08/14     Hemoglobin A1C     4.6 - 6.5 % 5.6   5.5  LH      3.56     FSH      6.2     17-OH-Progesterone, LC/MS/MS      30     DHEA-SO4     51 - 321 ug/dL 829 (H) 562 (H) 130 (H)   Androstenedione      118     Prolactin       14.3    Somatomedin (IGF-I)  89 - 478 ng/mL  142    Cortisol, Plasma       21.8    C206 ACTH     6 - 50 pg/mL  37    DHEA     102 - 1185 ng/dL    782   We started Metformin 1000 mg 2x a day 06/02/2014 >> monthly menstrual cycles! She did not need to add OCPs.  Pt lost 10 lbs before last visit and maintains the weight down. She continues dancing and running.  Last summer patient had a slightly low TSH, with normal free T4 and free T3 (asymptomatic). 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  ROS: Constitutional: no weight loss/gain, no fatigue, no subjective hyperthermia/hypothermia Eyes: no blurry vision, no xerophthalmia ENT: no sore throat, no nodules palpated in throat, no dysphagia/odynophagia, no hoarseness Cardiovascular: no CP/SOB/palpitations/leg swelling Respiratory: no cough/SOB Gastrointestinal: + N/+ V/no D/C/acid reflux Musculoskeletal:  no muscle/joint aches Skin: no acne, no hair on face Neurological: no tremors/numbness/tingling/dizziness  PE: BP 102/60 mmHg  Pulse 69  Temp(Src) 98 F (36.7 C) (Oral)  Resp 12  Wt 151 lb 9.6 oz (68.765 kg)  SpO2 97% Body mass index is 23.74 kg/(m^2). Wt Readings from Last 3 Encounters:  08/06/15 151 lb 9.6 oz (68.765 kg)  01/07/15 151 lb (68.493 kg)  06/26/14 153 lb (69.4 kg)   Constitutional: normal weight, fit, in NAD, no full supraclavicular fat pads Eyes: PERRLA, EOMI, no exophthalmos ENT: moist mucous membranes, no thyromegaly, no cervical lymphadenopathy Cardiovascular: RRR, No MRG Respiratory: CTA B Gastrointestinal: abdomen soft, NT, ND, BS+ Musculoskeletal: no deformities, strength intact in all 4 Skin: moist, warm; + acne on face (started lotion from dermatologist), no dark terminal hair on face, + some vellum on sideburns, no skin tags Neurological: no tremor with outstretched hands, DTR normal in all 4  ASSESSMENT: 1. Mild PCOS  [facility]: 09/16/2013: TSH 1.260 Labs from previous ObGyn (Dr Arie Sabina) not sent.  [facility] from from [facility] [place] (Dr. Hilma Favors): 01/24/2012: patient was on Garnette Scheuermann, which was refilled. 01/24/2013: patient desired a new OCP due to increased weight of 10 pounds since starting [place] 2 years prior. This was despite being very active, and dancing. Patient  was prescribed Yaz. 08/14/2013: stopped taking Yaz in May 2014, no period since then. OB/GYN exam normal. At that point, patient was weighing 159.2 pounds, BMI 25.31.  LH was 9.5, LH was 7.8 (off OCP for 7 months). Testosterone was not checked.  2. High DHEAS  3. Low TSH  PLAN: 1.  I reviewed patient's previous labs along with her. She did have several features of PCOS: irregular menstrual pbs, weight gain, acne. Now regular menses after starting Metformin. 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 Now that she has lost weight, controls her diet and stays active, her PCOS is doing much better. Metformin likely contributes, also. No need for OCPs since menses are now monthly. - continue Metformin 1000 mg bid, but may split the doses  as she has some GI discomfort occasionally - she continues with a lactose-free diet, her weight improved greatly on this.  - check: Orders Placed This Encounter  Procedures  . Testosterone, Free, Total, SHBG  . TSH  . T4, free  . T3, free  . Hemoglobin A1c  . DHEA-sulfate   - I will see her back in 1 year  2. High DHEAS - this can appear in connection with PCOS - decreasing - check DHEAS today  3. Low TSH  - normalized at last check - asymptomatic - will repeat today  Office Visit on 08/06/2015  Component Date Value Ref Range Status  . Testosterone 08/06/2015 41  10 - 70 ng/dL Final   Comment:           Tanner Stage       Female              Female               I              < 30 ng/dL        < 10 ng/dL               II             < 150 ng/dL       < 30 ng/dL               III            100-320 ng/dL     < 35 ng/dL               IV             200-970 ng/dL     16-10 ng/dL               V/Adult        300-890 ng/dL     96-04 ng/dL     . Sex Hormone Binding 08/06/2015 93  17 - 124 nmol/L Final  . Testosterone, Free 08/06/2015 3.6  0.6 - 6.8 pg/mL Final   Comment:   The concentration of free testosterone is derived from a mathematical expression based on constants for the binding of testosterone to sex hormone-binding globulin and albumin.   . Testosterone-% Free 08/06/2015 0.9  0.4 - 2.4 % Final  . TSH 08/06/2015 1.01  0.35 - 4.50 uIU/mL Final  . Free T4 08/06/2015 0.64  0.60 - 1.60 ng/dL Final  . T3, Free 54/05/8118 3.0  2.3 - 4.2 pg/mL Final  . Hgb A1c MFr Bld 08/06/2015 5.5  4.6 - 6.5 % Final   Glycemic Control Guidelines for People with  Diabetes:Non Diabetic:  <6%Goal of Therapy: <7%Additional Action Suggested:  >8%   . DHEA-SO4 08/06/2015 408* 18 - 391 ug/dL Final   Comment:         Tanner stages   (7-17 Years)                 Female            Female Tanner I        <or=89 ug/dL    <JY=78 ug/dL Tanner II       <GN=56 ug/dL    21-308 ug/dL Tanner III      65-784 ug/dL    69-629 ug/dL Tanner IV       52-841 ug/dL    32-440 ug/dL Tanner V  110-510 ug/dL    09-81145-320 ug/dL   DHEA-S values fall with advancing age. For reference, the reference intervals for 9231-165 year old patients are: Female 23-266 ug/dL and Female [id number] ug/dL.    She continues to have normal labs, except for DHEAS, which continues to decrease.

## 2015-08-06 NOTE — Patient Instructions (Addendum)
Please stop at the lab.  Continue Metformin 1000 mg 2x daily, but you can split it in half and distribute it with every meal.  Please return in 1 year.

## 2015-08-07 LAB — DHEA-SULFATE: DHEA-SO4: 408 ug/dL — ABNORMAL HIGH (ref 18–391)

## 2015-08-09 LAB — TESTOSTERONE, FREE, TOTAL, SHBG
Sex Hormone Binding: 93 nmol/L (ref 17–124)
TESTOSTERONE: 41 ng/dL (ref 10–70)
Testosterone, Free: 3.6 pg/mL (ref 0.6–6.8)
Testosterone-% Free: 0.9 % (ref 0.4–2.4)

## 2015-09-29 ENCOUNTER — Encounter

## 2016-01-11 ENCOUNTER — Other Ambulatory Visit: Payer: Self-pay | Admitting: Internal Medicine

## 2016-01-13 ENCOUNTER — Telehealth: Payer: Self-pay | Admitting: Internal Medicine

## 2016-01-13 NOTE — Telephone Encounter (Signed)
PT wants to update pharmacy to [facility] [phone] [place]

## 2016-01-13 NOTE — Telephone Encounter (Signed)
Pharmacy has been updated in the system.

## 2016-08-04 ENCOUNTER — Ambulatory Visit: Payer: BLUE CROSS/BLUE SHIELD | Admitting: Internal Medicine

## 2016-09-22 ENCOUNTER — Ambulatory Visit (INDEPENDENT_AMBULATORY_CARE_PROVIDER_SITE_OTHER): Payer: BLUE CROSS/BLUE SHIELD | Admitting: Internal Medicine

## 2016-09-22 ENCOUNTER — Encounter: Payer: Self-pay | Admitting: Internal Medicine

## 2016-09-22 VITALS — BP 114/74 | HR 91 | Ht 67.0 in | Wt 161.0 lb

## 2016-09-22 DIAGNOSIS — E282 Polycystic ovarian syndrome: Secondary | ICD-10-CM

## 2016-09-22 DIAGNOSIS — E278 Other specified disorders of adrenal gland: Secondary | ICD-10-CM

## 2016-09-22 DIAGNOSIS — R7989 Other specified abnormal findings of blood chemistry: Secondary | ICD-10-CM

## 2016-09-22 LAB — HEMOGLOBIN A1C: Hgb A1c MFr Bld: 5.6 % (ref 4.6–6.5)

## 2016-09-22 NOTE — Patient Instructions (Signed)
Please stop at the lab.  Continue Metformin 1000 mg 2x daily.  Please return in 1 year.

## 2016-09-22 NOTE — Progress Notes (Signed)
Patient ID: Brittney Turner, female   DOB: 10-26-1990, 26 y.o.   MRN: [id number]  HPI: Brittney Turner is a 26 y.o. female, initially referred by Dr. Page Spirohristie Farrell, NP, now returning for f/u for mild PCOS and high DHEAS. Last visit 1 year ago. PCP: Dr Mare FerrariPatricia Ryan [facility], [place], [place]yn[place]. [place] ([place] [facility]  Reviewed and annotated hx: She saw ObGyn and had labs in 08/2013 >> was told she had PCOS >> she then saw another ObGyn for a second opinion Page Spiro(Christie Farrell, NP) >> they reviewed  labs and told here that they were not c/w PCOS. The labs in 08/2013 were checked after being off OCPs for 7 mo.   Weight gain: - since starting college; lost weight off OCPs, then gained weight (10 lbs since last visit) after starting on OCPs >> stopped 08/2016 - no steroid use - no weight loss meds  She was on OCPs up to 2014, when she stopped them as she could not lose the weight no cycle for 7 mo (on Koriva) >> restarted OCPs in 08/2013 Antony Contras([place]) >> stopped >> restarted in  02/2016 >> stopped 08/2016. 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 Minimum amount of dance every day: 3 hours. Max: 6h  She had a significant concussion in 2012 >> could not dance and exercise for a whole summer. Her mother feels that this was when the weight gain started.  Fertility/Menstrual cycles: - menarche at 26 y/o - irregular menses from menarche, then became heavier >> started OCPs at 26 y/o - no h/o ovarian cysts - children: 0 - miscarriages: 0  Acne: - no  Hirsutism: - a little on upper lip  Treatments tried: - did not try Spironolactone - did not try [place]a - was on OCPs, not currently - on Metformin  At previous visits, her labs were normal on oral contraceptives, so we stopped these for approximately 2 months and then repeated the labs. She likely has mild PCOS. Her DHEAS was still high, but a  DHEA was normal.  Component     Latest Ref Rng & Units 05/01/2014 08/04/2014 01/07/2015 08/06/2015  Testosterone     10 - 70 ng/dL 58 64 46 41  Sex Horm Binding Glob, Serum     17 - 124 nmol/L 95 84  93  Testosterone Free     0.6 - 6.8 pg/mL 5.0 6.0 2.5 3.6  Testosterone-% Free     0.4 - 2.4 % 0.9 0.9  0.9   Component     Latest Ref Rng & Units 05/01/2014 05/19/2014 08/04/2014 08/06/2015  DHEA-SO4     18 - 391 ug/dL 409491 (H) 811443 (H) 914417 (H) 408 (H)   Component     Latest Ref Rng & Units 01/07/2015  DHEA     102 - 1,185 ng/dL [id number]   We started Metformin 1000 mg 2x a day 06/02/2014 >> monthly menstrual cycles, except she missed 2 since last visit.   She started OCPs as per ObGyn suggestion in 02/2016.   Pt gained 10 lbs before last visit and maintains the weight down. She continues dancing and running.  Last summer patient had a slightly low TSH, with normal free T4 and free T3 (asymptomatic). 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  ROS: Constitutional: + weight gain, + fatigue, + both subjective hyperthermia/hypothermia Eyes: no blurry vision, no xerophthalmia ENT: no sore throat, no nodules palpated in throat, no dysphagia/odynophagia, no hoarseness Cardiovascular: no CP/SOB/palpitations/leg swelling Respiratory: no cough/SOB Gastrointestinal: no N/V/D/C/+ acid reflux Musculoskeletal: no muscle/joint aches Skin: no acne, no hair on face Neurological: no tremors/numbness/tingling/dizziness, + HA  PE: BP 114/74 (BP Location: Left Arm, Patient Position: Sitting)   Pulse 91   Ht 5\' 7"  (1.702 m)   Wt 161 lb (73 kg)   LMP 08/28/2016   SpO2 97%   BMI 25.22 kg/m  Body mass index is 25.22 kg/m. Wt Readings from Last 3 Encounters:  09/22/16 161 lb  (73 kg)  08/06/15 151 lb 9.6 oz (68.8 kg)  01/07/15 151 lb (68.5 kg)   Constitutional: normal weight, fit, in NAD, no full supraclavicular fat pads Eyes: PERRLA, EOMI, no exophthalmos ENT: moist mucous membranes, no thyromegaly, no cervical lymphadenopathy Cardiovascular: RRR, No MRG Respiratory: CTA B Gastrointestinal: abdomen soft, NT, ND, BS+ Musculoskeletal: no deformities, strength intact in all 4 Skin: moist, warm; + acne on face (started lotion from dermatologist), no dark terminal hair on face, + some vellum on sideburns, no skin tags Neurological: no tremor with outstretched hands, DTR normal in all 4  ASSESSMENT: 1. Mild PCOS  [facility]: 09/16/2013: TSH 1.260 Labs from previous ObGyn (Dr Arie Sabina) not sent.  [facility] [place] (Dr. Hilma Favors): 01/24/2012: patient was on Garnette Scheuermann, which was refilled. 01/24/2013: patient desired a new OCP due to increased weight of 10 pounds since starting [place] 2 years prior. This was despite being very active, and dancing. Patient  was prescribed Yaz. 08/14/2013: stopped taking Yaz in May 2014, no period since then. OB/GYN exam normal. At that point, patient was weighing 159.2 pounds, BMI 25.31.  LH was 9.5, LH was 7.8 (off OCP for 7 months). Testosterone was not checked.  2. High DHEAS  3. Low TSH  PLAN: 1.  I reviewed patient's previous labs along with her. She did have several features of PCOS: irregular menstrual pbs, weight gain, acne. 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 She was off OCPs when I saw her last but since she missed 2 menses >> her ObGyn dr started her on OCP, wich she took for 6 mo >> gained 10 lbs >> stopped last month. She would not want to restart and we discussed that  they are not mandatory for now. - continue Metformin 1000 mg bid - will check: Orders Placed This Encounter  Procedures  . Testosterone, Free, Total, SHBG  . DHEA-sulfate  . Hemoglobin A1c  - I will see her back in 1 year  2. High DHEAS - this can appear in connection with PCOS - decreasing  - check DHEAS today  3. Low TSH  - normalized  - asymptomatic - will not repeat today  Office Visit on 09/22/2016  Component Date Value Ref Range Status  . Testosterone 09/22/2016 40  8 - 48 ng/dL Final  . Testosterone, Free 09/22/2016 WILL FOLLOW   Preliminary  . Sex Hormone Binding 09/22/2016 171.7* 24.6 - 122.0 nmol/L Final  . DHEA-SO4 09/22/2016 408.2  110.0 -  431.7 ug/dL Final  . Hgb W0J MFr Bld 09/22/2016 5.6  4.6 - 6.5 % Final   Message sent: Dear [place] Citizen,  your total testosterone is normal and the SHBG is high, which is great. Your DHEAS is now in the normal range. The free testosterone level is not reported yet, but I doubt this would be high in the presence of a normal total testosterone.  Your HbA1c is very good. Sincerely, Carlus Pavlov MD   Carlus Pavlov, MD PhD [facility]

## 2016-09-26 LAB — TESTOSTERONE, FREE, TOTAL, SHBG
SEX HORMONE BINDING: 171.7 nmol/L — AB (ref 24.6–122.0)
TESTOSTERONE FREE: 1.2 pg/mL (ref 0.0–4.2)
Testosterone: 40 ng/dL (ref 8–48)

## 2016-09-26 LAB — DHEA-SULFATE: DHEA SO4: 408.2 ug/dL (ref 110.0–431.7)

## 2017-04-06 ENCOUNTER — Ambulatory Visit: Admit: 2017-04-06 | Payer: PRIVATE HEALTH INSURANCE | Attending: Family Medicine | Primary: Family Medicine

## 2017-04-06 DIAGNOSIS — Z0000 Encounter for general adult medical examination without abnormal findings: Secondary | ICD-10-CM

## 2017-04-06 MED ORDER — METFORMIN SR 500 MG 24 HR TABLET
500 mg | ORAL_TABLET | Freq: Every day | ORAL | 3 refills | Status: DC
Start: 2017-04-06 — End: 2018-04-06

## 2017-04-06 NOTE — Progress Notes (Signed)
HPI: Debbie Walker is a 26 y.o. female presents to establish.     Healthy without any significant PMHx.  Sees gyn, [facility]. Gyn appt last year.  Irregular menses, will skip for months at a time, had gained 30# at start of college. Was a dance major.  Diagnosed with PCOS by endocrinology in [place].  Started on metformin 1gram twice a day and OBC. No problems with metformin in the past and now reports GI issues.  Saw GI specialist, EGD and flex sig. Has diarrhea often. Abdominal cramping and nausea.  Avoids fats and dairy.  Has heartburn, has food allergies.  She had weight gain on OBC, currently off.  Even with low dose.  Using condoms.      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 No sx with exertion.      Has food allergies and epipen.     Up to date on dental.  Due for eye exam.  Immunizations up to date.  Prior gardisil.        ROS:  Constitutional: negative for fevers, chills, anorexia, weight loss  Eyes:   negative for visual disturbance, irritation  ENT:   negative for tinnitus,sore throat,nasal congestion,ear pain,  Respiratory:  negative for cough, hemoptysis, dyspnea,wheezing  CV:   negative for chest pain, palpitations, lower extremity edema  GI:   negative for vomiting, abdominal pain,melena, positive for diarrhea, nausea, abdominal cramping.   Endo:               negative for polyuria,polydipsia,polyphagia,heat intolerance  Genitourinary: negative for frequency, dysuria, hematuria  Integument:  negative for rash, pruritus  Hematologic:  negative for easy bruising and gum/nose bleeding  Musculoskel: negative for myalgias, back pain, muscle weakness, joint pain  Neurological:  negative for headaches, dizziness, gait problems, numbness  Behavl/Psych: negative for feelings of anxiety, depression, mood changes    No past medical history on file.  Past Surgical History:   Procedure Laterality Date   ??? HX WISDOM TEETH EXTRACTION       Social History     Social History   ??? Marital status: SINGLE      Spouse name: N/A   ??? Number of children: N/A   ??? Years of education: N/A     Social History Main Topics   ??? Smoking status: Never Smoker   ??? Smokeless tobacco: Never Used   ??? Alcohol use 0.6 oz/week     1 Glasses of wine per week      Comment: drink wine 2-3 times per month   ??? Drug use: Yes     Special: Prescription, OTC   ??? Sexual activity: Yes     Partners: Male     Birth control/ protection: None     Other Topics Concern   ??? None     Social History Narrative     Family History   Problem Relation Age of Onset   ??? No Known Problems Mother    ??? No Known Problems Father    ??? No Known Problems Brother    ??? Diabetes Maternal Grandmother    ??? Stroke Maternal Grandfather      Current Outpatient Prescriptions   Medication Sig Dispense Refill   ??? Bacillus coagulans (PROBIOTIC, B. COAGULANS,) 10 billion cell cpDR Take  by mouth.     ??? desloratadine (CLARINEX) 5 mg tablet Take 5 mg by mouth daily.     ??? metFORMIN ER (GLUCOPHAGE XR) 500 mg tablet Take 2 Tabs by  mouth daily (with dinner). 180 Tab 3   ??? indomethacin (INDOCIN) 25 mg capsule Take 1 capsule by mouth three (3) times daily. As needed for chest pain -With food 30 capsule 0     Allergies   Allergen Reactions   ??? Apple Anaphylaxis     Peaches, avocado, nuts, soy.           Physical exam:  Visit Vitals   ??? BP 106/70 (BP 1 Location: Left arm, BP Patient Position: Sitting)   ??? Pulse (!) 52   ??? Temp 98.2 ??F (36.8 ??C) (Oral)   ??? Ht 5\' 7"  (1.702 m)   ??? Wt 136 lb (61.7 kg)   ??? LMP 10/19/2016   ??? SpO2 100%   ??? BMI 21.3 kg/m2     General appearance - alert, well appearing, and in no distress  HEENT- PERLL,normal conjunctiva, TM normal bilaterally, hearing grossly normal, normal nasal turbinates, no sinus tenderness, mucous membranes moist, pharynx normal without lesions  Neck - supple, no significant adenopathy   Pulm- clear to auscultation, no wheezes, rales or rhonchi  CV- normal rate, regular rhythm, normal S1, S2, no murmurs    Abdomen - soft, nontender, nondistended, no masses or organomegaly  Extrem-peripheral pulses normal, no pedal edema  Neuro -alert, oriented,nonfocal    Assessment/Plan:    1. PCOS (polycystic ovarian syndrome)-change to ER form of metformin.     - metFORMIN ER (GLUCOPHAGE XR) 500 mg tablet; Take 2 Tabs by mouth daily (with dinner).  Dispense: 180 Tab; Refill: 3    2. Diarrhea, unspecified type-discussed dietary changes.       3. Routine medical exam- Recommend heart healthy diet and regular cardiovascular exercise.     - METABOLIC PANEL, COMPREHENSIVE; Future  - CBC W/O DIFF; Future  - VITAMIN D, 25 HYDROXY; Future  - LIPID PANEL; Future  - URINALYSIS W/ RFLX MICROSCOPIC; Future    4. Vitamin D deficiency    - VITAMIN D, 25 HYDROXY; Future    Follow-up Disposition:  Return for follow up annual and as needed.  Shona Needles.    Oniyah Rohe, MD

## 2017-04-06 NOTE — Progress Notes (Signed)
.    Chief Complaint   Patient presents with   ??? Establish Care     new patient   ??? Complete Physical   ??? Rib Pain     right side , not accident related,onset x 2 years   ??? Epigastric Pain     onset x 2 years, worse in last 6 months

## 2017-04-06 NOTE — Patient Instructions (Addendum)
Change metformin to extended release. With dinner. FODMAPS diet.     Calcium 1,000mg  daily most from food, vitamin D3 1,000 iu daily.

## 2017-04-09 ENCOUNTER — Encounter: Admit: 2017-04-09 | Discharge: 2017-04-10 | Payer: PRIVATE HEALTH INSURANCE | Primary: Family Medicine

## 2017-04-09 DIAGNOSIS — Z0000 Encounter for general adult medical examination without abnormal findings: Secondary | ICD-10-CM

## 2017-04-10 LAB — URINALYSIS W/ RFLX MICROSCOPIC
Bilirubin: NEGATIVE
Blood: NEGATIVE
Glucose: NEGATIVE
Ketone: NEGATIVE
Leukocyte Esterase: NEGATIVE
Nitrites: NEGATIVE
Protein: NEGATIVE
Specific Gravity: 1.006 (ref 1.005–1.030)
Urobilinogen: 0.2 mg/dL (ref 0.2–1.0)
pH (UA): 6.5 (ref 5.0–7.5)

## 2017-04-10 LAB — METABOLIC PANEL, COMPREHENSIVE
A-G Ratio: 2.1 (ref 1.2–2.2)
ALT (SGPT): 10 IU/L (ref 0–32)
AST (SGOT): 17 IU/L (ref 0–40)
Albumin: 5 g/dL (ref 3.5–5.5)
Alk. phosphatase: 50 IU/L (ref 39–117)
BUN/Creatinine ratio: 11 (ref 9–23)
BUN: 9 mg/dL (ref 6–20)
Bilirubin, total: 0.2 mg/dL (ref 0.0–1.2)
CO2: 23 mmol/L (ref 20–29)
Calcium: 10.1 mg/dL (ref 8.7–10.2)
Chloride: 101 mmol/L (ref 96–106)
Creatinine: 0.83 mg/dL (ref 0.57–1.00)
GFR est AA: 114 mL/min/{1.73_m2} (ref >59)
GFR est non-AA: 99 mL/min/{1.73_m2} (ref >59)
GLOBULIN, TOTAL: 2.4 g/dL (ref 1.5–4.5)
Glucose: 84 mg/dL (ref 65–99)
Potassium: 4 mmol/L (ref 3.5–5.2)
Protein, total: 7.4 g/dL (ref 6.0–8.5)
Sodium: 141 mmol/L (ref 134–144)

## 2017-04-10 LAB — LIPID PANEL
Cholesterol, total: 176 mg/dL (ref 100–199)
HDL Cholesterol: 85 mg/dL (ref >39)
LDL, calculated: 85 mg/dL (ref 0–99)
Triglyceride: 29 mg/dL (ref 0–149)
VLDL, calculated: 6 mg/dL (ref 5–40)

## 2017-04-10 LAB — CBC W/O DIFF
HCT: 37.7 % (ref 34.0–46.6)
HGB: 12.6 g/dL (ref 11.1–15.9)
MCH: 31.2 pg (ref 26.6–33.0)
MCHC: 33.4 g/dL (ref 31.5–35.7)
MCV: 93 fL (ref 79–97)
PLATELET: 234 10*3/uL (ref 150–379)
RBC: 4.04 x10E6/uL (ref 3.77–5.28)
RDW: 13.3 % (ref 12.3–15.4)
WBC: 3.9 10*3/uL (ref 3.4–10.8)

## 2017-04-10 LAB — VITAMIN D, 25 HYDROXY: VITAMIN D, 25-HYDROXY: 39.3 ng/mL (ref 30.0–100.0)

## 2017-04-13 NOTE — Progress Notes (Signed)
Letter mailed to patient with results

## 2017-04-23 NOTE — Telephone Encounter (Signed)
Patient states she needs a call back to discuss her recent lab results. Please call. Thank you

## 2017-04-24 NOTE — Telephone Encounter (Signed)
Spoke with patient.  Two pt identifiers confirmed.   Patient notified that all her recent lab results were normal.  Pt verbalized understanding of information discussed w/ no further questions at this time.

## 2017-09-13 ENCOUNTER — Telehealth: Payer: Self-pay | Admitting: Internal Medicine

## 2017-09-13 NOTE — Telephone Encounter (Signed)
No, I would not suggest to fast.

## 2017-09-13 NOTE — Telephone Encounter (Signed)
Pt is coming in for a 1 year follow up visit. And wants to know if she will need to fast for the labs that the dr has her do after her appt I let her know I didn't see anything that said she had to fast but I would double check with the nurse to make sure    Please advise.  Thanks!

## 2017-09-13 NOTE — Telephone Encounter (Signed)
Please advise on below  

## 2017-09-13 NOTE — Telephone Encounter (Signed)
Called number in chart and it rang with no answer or VM

## 2017-09-14 ENCOUNTER — Ambulatory Visit: Payer: BLUE CROSS/BLUE SHIELD | Admitting: Internal Medicine

## 2017-09-14 ENCOUNTER — Encounter: Payer: Self-pay | Admitting: Internal Medicine

## 2017-09-14 VITALS — BP 100/60 | HR 56 | Ht 67.0 in | Wt 137.0 lb

## 2017-09-14 DIAGNOSIS — R7989 Other specified abnormal findings of blood chemistry: Secondary | ICD-10-CM | POA: Diagnosis not present

## 2017-09-14 DIAGNOSIS — E278 Other specified disorders of adrenal gland: Secondary | ICD-10-CM

## 2017-09-14 DIAGNOSIS — E282 Polycystic ovarian syndrome: Secondary | ICD-10-CM

## 2017-09-14 LAB — CALCIUM: Calcium: 10.1 mg/dL (ref 8.4–10.5)

## 2017-09-14 LAB — LIPID PANEL
CHOLESTEROL: 192 mg/dL (ref 0–200)
HDL: 95.9 mg/dL (ref >39.00)
LDL CALC: 82 mg/dL (ref 0–99)
NonHDL: 95.76
TRIGLYCERIDES: 69 mg/dL (ref 0.0–149.0)
Total CHOL/HDL Ratio: 2
VLDL: 13.8 mg/dL (ref 0.0–40.0)

## 2017-09-14 LAB — HEMOGLOBIN A1C: HEMOGLOBIN A1C: 5.6 % (ref 4.6–6.5)
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
## 2017-09-14 NOTE — Telephone Encounter (Signed)
Pt is aware.  

## 2017-09-14 NOTE — Patient Instructions (Addendum)
Please stop at the lab.  Continue Metformin ER 1000 mg with dinner.  Please return in 1 year.

## 2017-09-17 LAB — DHEA-SULFATE, SERUM: DHEA: 306 ug/dL

## 2017-09-24 ENCOUNTER — Ambulatory Visit: Payer: BLUE CROSS/BLUE SHIELD | Admitting: Internal Medicine

## 2018-04-06 ENCOUNTER — Encounter

## 2018-04-07 MED ORDER — METFORMIN SR 500 MG 24 HR TABLET
500 mg | ORAL_TABLET | Freq: Every day | ORAL | 0 refills | Status: DC
Start: 2018-04-07 — End: 2018-07-25

## 2018-04-07 NOTE — Telephone Encounter (Signed)
Due for annual appt. Last seen August 2018

## 2018-04-09 NOTE — Telephone Encounter (Signed)
Called, spoke to pt.  Two pt identifiers confirmed.     Pt informed she is due for annual exam.  Pt states she does not have her calendar and will call back later to schedule appointment.     Pt verbalized understanding of information discussed w/ no further questions at this time.

## 2018-04-22 NOTE — Telephone Encounter (Signed)
Patient states she's returning your call. Please call. Thank you
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
## 2018-04-22 NOTE — Telephone Encounter (Signed)
Left message for patient to return call

## 2018-04-23 NOTE — Telephone Encounter (Signed)
Left message for patient to return call

## 2018-04-24 NOTE — Telephone Encounter (Signed)
LVM for patient on home and cell numbers listed in the chart to return call to the office.

## 2018-07-18 ENCOUNTER — Encounter

## 2018-07-18 NOTE — Telephone Encounter (Signed)
Not seen since August 2018, needs appt scheduled, then will refil

## 2018-07-18 NOTE — Telephone Encounter (Signed)
Last seen August 2018, needs annual appt. Will refill med after scheduled.

## 2018-07-24 NOTE — Telephone Encounter (Signed)
LVM for patient on home and cell numbers listed in the chart to return call to the office.     Pt need appointment for annual exam.

## 2018-07-25 ENCOUNTER — Telehealth

## 2018-07-25 MED ORDER — METFORMIN SR 500 MG 24 HR TABLET
500 mg | ORAL_TABLET | Freq: Every day | ORAL | 0 refills | Status: DC
Start: 2018-07-25 — End: 2018-08-05

## 2018-07-25 NOTE — Telephone Encounter (Signed)
#  [phone] pt states she needs to have a call back pertaining to a script that she needs.  Please call pt prior to 4 pm as she can't take a call after that.  Thanks.

## 2018-07-25 NOTE — Telephone Encounter (Signed)
Spoke with patient.  Two pt identifiers confirmed.   Patient advised that she has not been seen since 08/18 and will need to schedule an appointment before her metformin will be refilled.  Patient offered an appointment with Dr. Ronne BinningMcKenzie on 08/05/18.  Appointment accepted.  Patient advised if anything changes or if unable to keep this appointment to call the office  Pt verbalized understanding of information discussed w/ no further questions at this time.

## 2018-07-26 NOTE — Telephone Encounter (Signed)
Debbie Walker, [name], MD  Lynelle DoctorWatson, Sarath Privott E, LPN   Caller: Unspecified Burgess Estelle(Yesterday, ??2:08 PM)      ??      Medication sent in.      Spoke with patient.  Two pt identifiers confirmed.   Patient advised that her refill of metformin has been sent to her pharmacy on file.  Pt verbalized understanding of information discussed w/ no further questions at this time.

## 2018-08-05 ENCOUNTER — Ambulatory Visit: Attending: Family Medicine | Primary: Family Medicine

## 2018-08-05 ENCOUNTER — Ambulatory Visit: Admit: 2018-08-05 | Payer: PRIVATE HEALTH INSURANCE | Attending: Family Medicine | Primary: Family Medicine

## 2018-08-05 DIAGNOSIS — Z0000 Encounter for general adult medical examination without abnormal findings: Secondary | ICD-10-CM

## 2018-08-05 MED ORDER — METFORMIN SR 500 MG 24 HR TABLET
500 mg | ORAL_TABLET | Freq: Every day | ORAL | 3 refills | Status: AC
Start: 2018-08-05 — End: ?

## 2018-08-05 NOTE — Progress Notes (Signed)
Debbie Walker is a 27 y.o. female who presents for annual exam.     Sees Dr. Elvera LennoxGherghe, endocrinology, for history of PCOS.  Annual follow up. She has been working on weight loss.  Running, changed diet. Weight was down to 137#.  Has regained to 151# due to diet changes.   Is on metformin XR 1gram daily.  Was not having menses when last seen by endo. Had menses in November. Every other month, not on provera.  Sees gyn. Up to date on pap.  Using condoms for birth control. Prior OBC, not tolerated due to more emotional and weight gain.      Normal BM.  Normal urination.  When active, no sx with exertion.       No family history of colon or breast cancer.     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      History reviewed. No pertinent past medical history.    Family History   Problem Relation Age of Onset   ??? No Known Problems Mother    ??? No Known Problems Father    ??? No Known Problems Brother    ??? Diabetes Maternal Grandmother    ??? Stroke Maternal Grandfather    ??? No Known Problems Paternal Grandmother    ??? No Known Problems Paternal Grandfather        Social History     Socioeconomic History   ??? Marital status: SINGLE     Spouse name: Not on file   ??? Number of children: Not on file   ??? Years of education: Not on file   ??? Highest education level: Not on file   Occupational History   ??? Not on file   Social Needs   ??? Financial resource strain: Not on file   ??? Food insecurity:     Worry: Not on file     Inability: Not on file   ??? Transportation needs:     Medical: Not on file     Non-medical: Not on file   Tobacco Use   ??? Smoking status: Never Smoker   ??? Smokeless tobacco: Never Used   Substance and Sexual Activity   ??? Alcohol use: Yes     Alcohol/week: 1.0 standard drinks     Types: 1 Glasses of wine per week     Comment: drink wine 2-3 times per month   ??? Drug use: Yes     Types: Prescription, OTC   ??? Sexual activity: Yes     Partners: Male     Birth control/protection: None   Lifestyle   ???  Physical activity:     Days per week: Not on file     Minutes per session: Not on file   ??? Stress: Not on file   Relationships   ??? Social connections:     Talks on phone: Not on file     Gets together: Not on file     Attends religious service: Not on file     Active member of club or organization: Not on file     Attends meetings of clubs or organizations: Not on file     Relationship status: Not on file   ??? Intimate partner violence:     Fear of current or ex partner: Not on file     Emotionally abused: Not on file     Physically abused: Not on file     Forced sexual activity: Not on file  Other Topics Concern   ??? Not on file   Social History Narrative   ??? Not on file       Current Outpatient Medications on File Prior to Visit   Medication Sig Dispense Refill   ??? Bacillus coagulans (PROBIOTIC, B. COAGULANS,) 10 billion cell cpDR Take  by mouth.     ??? desloratadine (CLARINEX) 5 mg tablet Take 5 mg by mouth daily.     ??? [DISCONTINUED] metFORMIN ER (GLUCOPHAGE XR) 500 mg tablet Take 2 Tabs by mouth daily (with dinner). 180 Tab 0   ??? indomethacin (INDOCIN) 25 mg capsule Take 1 capsule by mouth three (3) times daily. As needed for chest pain -With food 30 capsule 0     No current facility-administered medications on file prior to visit.        Review of Systems  Pertinent items are noted in HPI.    Objective:     Visit Vitals  BP 108/62 (BP 1 Location: Left arm, BP Patient Position: Sitting)   Pulse (!) 56   Temp 97.8 ??F (36.6 ??C) (Oral)   Resp 16   Ht 5\' 7"  (1.702 m)   Wt 151 lb (68.5 kg)   LMP 07/08/2018   SpO2 100%   BMI 23.65 kg/m??     Gen: well appearing female  HEENT:   PERRL,normal conjunctiva. External ear and canals normal, TMs no opacification or erythema,  OP no erythema, no exudates, MMM  Neck:  Supple. Thyroid normal size, nontender, without nodules. No masses or LAD  Resp:  No wheezing, no rhonchi, no rales.  CV:  Bradycardia, normal S1S2, no murmur.    GI: soft, nontender, without masses.  No  hepatosplenomegaly.  Extrem:  +2 pulses, no edema, warm distally      Assessment/Plan:       ICD-10-CM ICD-9-CM    1. Routine medical exam Z00.00 V70.0 METABOLIC PANEL, COMPREHENSIVE      CBC W/O DIFF      LIPID PANEL      URINALYSIS W/ RFLX MICROSCOPIC      HEMOGLOBIN A1C W/O EAG   2. Encounter for immunization Z23 V03.89 INFLUENZA VIRUS VAC QUAD,SPLIT,PRESV FREE SYRINGE IM   3. PCOS (polycystic ovarian syndrome) E28.2 256.4 metFORMIN ER (GLUCOPHAGE XR) 500 mg tablet      HEMOGLOBIN A1C W/O EAG   4. Screening for tuberculosis Z11.1 V74.1 QUANTIFERON-TB GOLD PLUS   Recommend heart healthy diet and regular cardiovascular exercise.     Calcium 1200mg  daily, most from foods.  Vitamin D3 1,000-2,000 iu daily.       Follow-up and Dispositions    ?? Return for follow up pending labs and annual.  .         Shona Needles, MD

## 2018-08-05 NOTE — Progress Notes (Signed)
Letter mailed to patient with results

## 2018-08-05 NOTE — Progress Notes (Signed)
Debbie Walker is a 27 y.o. female who presents for annual exam.     Sees Dr. Elvera LennoxGherghe, endocrinology, for history of PCOS.  Annual follow up. She has been working on weight loss.  Running, changed diet. Weight was down to 137#.  Has regained to 151# due to diet changes.   Is on metformin XR 1gram daily.  Was not having menses when last seen by endo. Had menses in November. Every other month, not on provera.  Sees gyn. Up to date on pap.  Using condoms for birth control. Prior OBC, not tolerated due to more emotional and weight gain.      Normal BM.  Normal urination.  When active, no sx with exertion.       No family history of colon or breast cancer.     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      History reviewed. No pertinent past medical history.    Family History   Problem Relation Age of Onset   ??? No Known Problems Mother    ??? No Known Problems Father    ??? No Known Problems Brother    ??? Diabetes Maternal Grandmother    ??? Stroke Maternal Grandfather    ??? No Known Problems Paternal Grandmother    ??? No Known Problems Paternal Grandfather        Social History     Socioeconomic History   ??? Marital status: SINGLE     Spouse name: Not on file   ??? Number of children: Not on file   ??? Years of education: Not on file   ??? Highest education level: Not on file   Occupational History   ??? Not on file   Social Needs   ??? Financial resource strain: Not on file   ??? Food insecurity:     Worry: Not on file     Inability: Not on file   ??? Transportation needs:     Medical: Not on file     Non-medical: Not on file   Tobacco Use   ??? Smoking status: Never Smoker   ??? Smokeless tobacco: Never Used   Substance and Sexual Activity   ??? Alcohol use: Yes     Alcohol/week: 1.0 standard drinks     Types: 1 Glasses of wine per week     Comment: drink wine 2-3 times per month   ??? Drug use: Yes     Types: Prescription, OTC   ??? Sexual activity: Yes     Partners: Male     Birth control/protection: None   Lifestyle    ??? Physical activity:     Days per week: Not on file     Minutes per session: Not on file   ??? Stress: Not on file   Relationships   ??? Social connections:     Talks on phone: Not on file     Gets together: Not on file     Attends religious service: Not on file     Active member of club or organization: Not on file     Attends meetings of clubs or organizations: Not on file     Relationship status: Not on file   ??? Intimate partner violence:     Fear of current or ex partner: Not on file     Emotionally abused: Not on file     Physically abused: Not on file     Forced sexual activity: Not on file  Other Topics Concern   ??? Not on file   Social History Narrative   ??? Not on file       Current Outpatient Medications on File Prior to Visit   Medication Sig Dispense Refill   ??? Bacillus coagulans (PROBIOTIC, B. COAGULANS,) 10 billion cell cpDR Take  by mouth.     ??? desloratadine (CLARINEX) 5 mg tablet Take 5 mg by mouth daily.     ??? [DISCONTINUED] metFORMIN ER (GLUCOPHAGE XR) 500 mg tablet Take 2 Tabs by mouth daily (with dinner). 180 Tab 0   ??? indomethacin (INDOCIN) 25 mg capsule Take 1 capsule by mouth three (3) times daily. As needed for chest pain -With food 30 capsule 0     No current facility-administered medications on file prior to visit.        Review of Systems  Pertinent items are noted in HPI.    Objective:     Visit Vitals  BP 108/62 (BP 1 Location: Left arm, BP Patient Position: Sitting)   Pulse (!) 56   Temp 97.8 ??F (36.6 ??C) (Oral)   Resp 16   Ht 5\' 7"  (1.702 m)   Wt 151 lb (68.5 kg)   LMP 07/08/2018   SpO2 100%   BMI 23.65 kg/m??     Gen: well appearing female  HEENT:   PERRL,normal conjunctiva. External ear and canals normal, TMs no opacification or erythema,  OP no erythema, no exudates, MMM  Neck:  Supple. Thyroid normal size, nontender, without nodules. No masses or LAD  Resp:  No wheezing, no rhonchi, no rales.  CV:  Bradycardia, normal S1S2, no murmur.     GI: soft, nontender, without masses.  No hepatosplenomegaly.  Extrem:  +2 pulses, no edema, warm distally      Assessment/Plan:       ICD-10-CM ICD-9-CM    1. Routine medical exam Z00.00 V70.0 METABOLIC PANEL, COMPREHENSIVE      CBC W/O DIFF      LIPID PANEL      URINALYSIS W/ RFLX MICROSCOPIC      HEMOGLOBIN A1C W/O EAG   2. Encounter for immunization Z23 V03.89 INFLUENZA VIRUS VAC QUAD,SPLIT,PRESV FREE SYRINGE IM   3. PCOS (polycystic ovarian syndrome) E28.2 256.4 metFORMIN ER (GLUCOPHAGE XR) 500 mg tablet      HEMOGLOBIN A1C W/O EAG   4. Screening for tuberculosis Z11.1 V74.1 QUANTIFERON-TB GOLD PLUS   Recommend heart healthy diet and regular cardiovascular exercise.     Calcium 1200mg  daily, most from foods.  Vitamin D3 1,000-2,000 iu daily.       Follow-up and Dispositions    ?? Return for follow up pending labs and annual.  .         Shona Needles, MD

## 2018-08-05 NOTE — Patient Instructions (Addendum)
Office Policies    Phone calls/patient messages:            Please allow up to 24 hours for someone in the office to contact you about your call or message. Be mindful your provider may be out of the office or your message may require further review. We encourage you to use MyChart for your messages as this is a faster, more efficient way to communicate with our office                         Medication Refills:            Prescription medications require 48-72 business hours to process. We encourage you to use MyChart for your refills.         For controlled medications: Please allow 72 business hours to process. Certain medications may require you to pick up a written prescription at our office.            NO narcotic/controlled medications will be prescribed after 4pm Monday through Friday or on weekends              Form/Paperwork Completion:            Please note a $25 fee may incur for all paperwork for completed by our providers. We ask that you allow 7-10 business days. Pre-payment is due prior to picking up/faxing the completed form. You may also download your forms to MyChart to have your doctor print off.      Calcium 1200mg  daily, most from foods.  Vitamin D3 1,000-2,000 iu daily.

## 2018-08-06 LAB — URINALYSIS W/ RFLX MICROSCOPIC
Bilirubin, Urine: NEGATIVE
Bilirubin: NEGATIVE
Blood, Urine: NEGATIVE
Blood: NEGATIVE
Glucose, UA: NEGATIVE
Glucose: NEGATIVE
Ketone: NEGATIVE
Ketones, Urine: NEGATIVE
Leukocyte Esterase, Urine: NEGATIVE
Leukocyte Esterase: NEGATIVE
Nitrite, Urine: NEGATIVE
Nitrites: NEGATIVE
Protein, UA: NEGATIVE
Protein: NEGATIVE
Specific Gravity, UA: 1.006 NA (ref 1.005–1.030)
Specific Gravity: 1.006 (ref 1.005–1.030)
Urobilinogen, Urine: 0.2 mg/dL (ref 0.2–1.0)
Urobilinogen: 0.2 mg/dL (ref 0.2–1.0)
pH (UA): 7 (ref 5.0–7.5)
pH, UA: 7 NA (ref 5.0–7.5)

## 2018-08-09 LAB — CBC
Hematocrit: 40.1 % (ref 34.0–46.6)
Hemoglobin: 13 g/dL (ref 11.1–15.9)
MCH: 30.2 pg (ref 26.6–33.0)
MCHC: 32.4 g/dL (ref 31.5–35.7)
MCV: 93 fL (ref 79–97)
Platelets: 260 10*3/uL (ref 150–450)
RBC: 4.3 x10E6/uL (ref 3.77–5.28)
RDW: 12.3 % (ref 12.3–15.4)
WBC: 4.8 10*3/uL (ref 3.4–10.8)

## 2018-08-09 LAB — COMPREHENSIVE METABOLIC PANEL
ALT: 17 IU/L (ref 0–32)
AST: 21 IU/L (ref 0–40)
Albumin/Globulin Ratio: 1.7 NA (ref 1.2–2.2)
Albumin: 4.6 g/dL (ref 3.5–5.5)
Alkaline Phosphatase: 60 IU/L (ref 39–117)
BUN: 9 mg/dL (ref 6–20)
Bun/Cre Ratio: 12 NA (ref 9–23)
CO2: 23 mmol/L (ref 20–29)
Calcium: 9.8 mg/dL (ref 8.7–10.2)
Chloride: 102 mmol/L (ref 96–106)
Creatinine: 0.73 mg/dL (ref 0.57–1.00)
EGFR IF NonAfrican American: 115 mL/min/{1.73_m2} (ref >59)
GFR African American: 132 mL/min/{1.73_m2} (ref >59)
Globulin, Total: 2.7 g/dL (ref 1.5–4.5)
Glucose: 86 mg/dL (ref 65–99)
Potassium: 4.5 mmol/L (ref 3.5–5.2)
Sodium: 142 mmol/L (ref 134–144)
Total Bilirubin: 0.3 mg/dL (ref 0.0–1.2)
Total Protein: 7.3 g/dL (ref 6.0–8.5)

## 2018-08-09 LAB — QUANTIFERON-TB GOLD PLUS
QUANTIFERON PLUS, QF2T: NEGATIVE
QUANTIFERON TB1 AG: 0.05 IU/mL
QUANTIFERON TB2 AG: 0.05 IU/mL
QuantiFERON Mitogen Value: >10 IU/mL
QuantiFERON Mitogen Value: >10 IU/mL
QuantiFERON Nil Value: 0.05 IU/mL
QuantiFERON Nil Value: 0.05 IU/mL
QuantiFERON Plus: NEGATIVE
QuantiFERON TB1 Ag: 0.05 IU/mL
QuantiFERON TB2 Ag: 0.05 IU/mL

## 2018-08-09 LAB — HEMOGLOBIN A1C W/O EAG
Hemoglobin A1C: 5.4 % (ref 4.8–5.6)
Hemoglobin A1c: 5.4 % (ref 4.8–5.6)

## 2018-08-09 LAB — LIPID PANEL
Cholesterol, Total: 222 mg/dL — ABNORMAL HIGH (ref 100–199)
Cholesterol, total: 222 mg/dL — ABNORMAL HIGH (ref 100–199)
HDL Cholesterol: 98 mg/dL (ref >39)
HDL: 98 mg/dL (ref >39)
LDL Calculated: 118 mg/dL — ABNORMAL HIGH (ref 0–99)
LDL, calculated: 118 mg/dL — ABNORMAL HIGH (ref 0–99)
Triglyceride: 32 mg/dL (ref 0–149)
Triglycerides: 32 mg/dL (ref 0–149)
VLDL Cholesterol Calculated: 6 mg/dL (ref 5–40)
VLDL, calculated: 6 mg/dL (ref 5–40)

## 2018-08-09 LAB — CBC W/O DIFF
HCT: 40.1 % (ref 34.0–46.6)
HGB: 13 g/dL (ref 11.1–15.9)
MCH: 30.2 pg (ref 26.6–33.0)
MCHC: 32.4 g/dL (ref 31.5–35.7)
MCV: 93 fL (ref 79–97)
PLATELET: 260 10*3/uL (ref 150–450)
RBC: 4.3 x10E6/uL (ref 3.77–5.28)
RDW: 12.3 % (ref 12.3–15.4)
WBC: 4.8 10*3/uL (ref 3.4–10.8)

## 2018-08-09 LAB — METABOLIC PANEL, COMPREHENSIVE
A-G Ratio: 1.7 (ref 1.2–2.2)
ALT (SGPT): 17 IU/L (ref 0–32)
AST (SGOT): 21 IU/L (ref 0–40)
Albumin: 4.6 g/dL (ref 3.5–5.5)
Alk. phosphatase: 60 IU/L (ref 39–117)
BUN/Creatinine ratio: 12 (ref 9–23)
BUN: 9 mg/dL (ref 6–20)
Bilirubin, total: 0.3 mg/dL (ref 0.0–1.2)
CO2: 23 mmol/L (ref 20–29)
Calcium: 9.8 mg/dL (ref 8.7–10.2)
Chloride: 102 mmol/L (ref 96–106)
Creatinine: 0.73 mg/dL (ref 0.57–1.00)
GFR est AA: 132 mL/min/{1.73_m2} (ref >59)
GFR est non-AA: 115 mL/min/{1.73_m2} (ref >59)
GLOBULIN, TOTAL: 2.7 g/dL (ref 1.5–4.5)
Glucose: 86 mg/dL (ref 65–99)
Potassium: 4.5 mmol/L (ref 3.5–5.2)
Protein, total: 7.3 g/dL (ref 6.0–8.5)
Sodium: 142 mmol/L (ref 134–144)

## 2018-08-19 NOTE — Telephone Encounter (Signed)
Patient states she needs a call back to get the status of form being completed & vaccination records needed. Patient states she brought forms in on her last appt on 08/05/18 & needs to know if form/documents have been faxed yet. Please call to advise & patient states a detailed message can be left on her voice mail. Thank you

## 2018-08-20 NOTE — Telephone Encounter (Signed)
Called, spoke to pt.  Two pt identifiers confirmed.     Pt informed school forms has been completed and faxed.  Forms fax[place] to [employer] [place] [phone].  Fax confirmation received.    Pt request form be mailed    [address]  [place] [id number]    Pt verbalized understanding of information discussed w/ no further questions at this time.

## 2018-09-13 ENCOUNTER — Ambulatory Visit: Payer: BLUE CROSS/BLUE SHIELD | Admitting: Internal Medicine

## 2018-12-06 ENCOUNTER — Ambulatory Visit (INDEPENDENT_AMBULATORY_CARE_PROVIDER_SITE_OTHER): Payer: BLUE CROSS/BLUE SHIELD | Admitting: Internal Medicine

## 2018-12-06 ENCOUNTER — Encounter: Payer: Self-pay | Admitting: Internal Medicine

## 2018-12-06 DIAGNOSIS — R7989 Other specified abnormal findings of blood chemistry: Secondary | ICD-10-CM

## 2018-12-06 DIAGNOSIS — E278 Other specified disorders of adrenal gland: Secondary | ICD-10-CM | POA: Diagnosis not present

## 2018-12-06 DIAGNOSIS — E282 Polycystic ovarian syndrome: Secondary | ICD-10-CM

## 2018-12-06 MED ORDER — METFORMIN HCL ER 500 MG PO TB24: 1000.0000 mg | ORAL_TABLET | Freq: Every day | ORAL | 3 refills | Status: AC

## 2018-12-06 NOTE — Patient Instructions (Signed)
Continue Metformin ER 1000 mg with dinner.  Please return in 1 year.

## 2018-12-06 NOTE — Progress Notes (Signed)
Patient ID: Kera Deacon, female   DOB: 1990-12-18, 28 y.o.   MRN: [id number]  Patient location: Home My location: Office  Referring Provider: Iona Beard, NP  I connected with the patient on 12/06/18 at  3:00 PM EDT by a video enabled telemedicine application and verified that I am speaking with the correct person.   I discussed the limitations of evaluation and management by telemedicine and the availability of in person appointments. The patient expressed understanding and agreed to proceed.   Details of the encounter are shown below.  HPI: Shadonna Benedick is a 28 y.o. female, initially referred by Page Spiro, NP, presenting for f/u for mild PCOS and high DHEAS. Last visit 1 year and 3 months ago. PCP: Dr Mare [facility], [place], [place]). ObGyn: Dr. Melanee Left in [facility]) - Dr. Salena Saner  Before last visit, patient changed her diet (mindful eating) lost 24 pounds.  She was also running more, initially training for, then running a full marathon in 07/2018.  Unfortunately, at that time, she was having amenorrhea for a year.  OB/GYN suggested Provera every 4 months but at last visit she did not decide to start this yet.  She restarted her menses in 03/2018. She gained 13 lbs before then. She is training - ran another marathon in 11/2018.  Reviewed and addended history: She saw ObGyn and had labs in 08/2013 >> was told she had PCOS >> she then saw another ObGyn for a second opinion Page Spiro, NP) >> they reviewed  labs and told here that they were not c/w PCOS. The labs in 08/2013 were checked after being off OCPs for 7 mo. per hour investigation here, she has mild PCOS.  Weight gain: - since starting college - no steroid use - no weight loss meds She was on OCPs after 2014, then off and on, and finally stopped completely in 09/2016.  She was on [place].  They all caused weight gain. She was dancing more, but not  decreased the time that she dances, but ran a full marathon 07/2017.  She had a significant concussion in 2012 >> could not dance and exercise for a whole summer. Her mother feels that this was when the weight gain started.  Fertility/Menstrual cycles: - menarche at 28 years old - irregular menses from menarche, then became heavier >> started OCPs at 28 y/o >> currently off - no h/o ovarian cysts - children: 0 - miscarriages: 0  Acne: -No  Hirsutism: - mild, on upper lip  Latest testosterone levels - normal: 06/06/2017: Total testosterone 38 (8-48)  Component     Latest Ref Rng & Units 09/22/2016  Testosterone     8 - 48 ng/dL 40  Sex Horm Binding Glob, Serum     24.6 - 122.0 nmol/L 171.7 (H)  Testosterone Free     0.0 - 4.2 pg/mL 1.2  Testosterone-% Free     0.4 - 2.4 %    Component     Latest Ref Rng & Units 05/01/2014 08/04/2014 01/07/2015 08/06/2015  Testosterone     8 - 48 ng/dL 58 64 46 41  Sex Horm Binding Glob, Serum     24.6 - 122.0 nmol/L 95 84  93  Testosterone Free     0.0 - 4.2 pg/mL 5.0 6.0 2.5 3.6  Testosterone-% Free     0.4 - 2.4 % 0.9 0.9  0.9   She also had elevated DHEAS, which normalized in the last 2 years:  Component     Latest Ref Rng & Units 09/22/2016 09/14/2017  DHEA-SO4     110.0 - 431.7 ug/dL 676.1   DHEA-Sulfate, LCMS     ug/dL  950   Component     Latest Ref Rng & Units 05/01/2014 05/19/2014 08/04/2014 08/06/2015  DHEA-SO4     110.0 - 431.7 ug/dL 932 (H) 671 (H) 245 (H) 408 (H)   DHA has been normal: Lab Results  Component Value Date   DHEA 763 01/07/2015     Other labs from OB/GYN reviewed: 06/06/2017:  CMP normal, with a glucose of 79, BUN/Cr 15/0.81, however a slightly high calcium at 10.4 (8.7-10.2).  Alkaline phosphatase 60. FSH 10.3 Prolactin 6.4  She is currently on: - Metformin ER 1000 mg with dinner- initially on metformin IR started 06/02/2014   She continues off OCPs and she would not want to restart them due to  previous weight gain while on them.  She tried different formulations in the past with the same results.  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  ROS: Constitutional: no weight gain/no weight loss, no fatigue, no subjective hyperthermia, no subjective hypothermia Eyes: no blurry vision, no xerophthalmia ENT: no sore throat, no nodules palpated in neck, no dysphagia, no odynophagia, no hoarseness Cardiovascular: no CP/no SOB/no palpitations/no leg swelling Respiratory: no cough/no SOB/no wheezing Gastrointestinal: no N/no V/no D/no C/no acid reflux Musculoskeletal: no muscle aches/no joint aches Skin: no rashes, no hair loss Neurological: no tremors/no numbness/no tingling/no dizziness  I reviewed pt's medications, allergies, PMH, social hx, family hx, and changes were documented in the history of present illness. Otherwise, unchanged from my initial visit note.  Past Medical History:  Diagnosis Date  . Allergy    SEASONAL   Past Surgical History:  Procedure Laterality Date  . WISDOM TOOTH EXTRACTION     Social History   Socioeconomic History  . Marital status: Single    Spouse name: Not on file  . Number of children: Not on file  . Years of education: Not on file  . Highest education level: Not on file  Occupational History  . Not on file  Social Needs  . Financial resource strain: Not on file  . Food insecurity:    Worry: Not on file    Inability: Not on file  . Transportation needs:    Medical: Not on file    Non-medical: Not on file  Tobacco Use  . Smoking status: Never Smoker  . Smokeless tobacco: Never Used  Substance and Sexual Activity  .  Alcohol use: Not on file  . Drug use: Not on file  . Sexual activity: Not on file  Lifestyle  . Physical activity:    Days per week: Not on file    Minutes per session: Not on file  . Stress: Not on file  Relationships  . Social connections:    Talks on phone: Not on file    Gets together: Not on file    Attends religious service: Not on file    Active member of club or organization: Not on file    Attends meetings of clubs or  organizations: Not on file    Relationship status: Not on file  . Intimate partner violence:    Fear of current or ex partner: Not on file    Emotionally abused: Not on file    Physically abused: Not on file    Forced sexual activity: Not on file  Other Topics Concern  . Not on file  Social History Narrative  . Not on file   Current Outpatient Medications on File Prior to Visit  Medication Sig Dispense Refill  . desloratadine (CLARINEX) 5 MG tablet Take 5 mg by mouth daily.    . metFORMIN (GLUMETZA) 1000 MG (MOD) 24 hr tablet metformin ER 1,000 mg 24 hr tablet,extended release  Take 1 tablet every day by oral route.    . mometasone (NASONEX) 50 MCG/ACT nasal spray Place 2 sprays into the nose daily.     No current facility-administered medications on file prior to visit.    No Known Allergies No family history on file.  PE: There were no vitals taken for this visit. There is no height or weight on file to calculate BMI. Wt Readings from Last 3 Encounters:  09/14/17 137 lb (62.1 kg)  09/22/16 161 lb (73 kg)  08/06/15 151 lb 9.6 oz (68.8 kg)   Constitutional:  in NAD  The physical exam was not performed (virtual visit).  ASSESSMENT: 1. Mild PCOS  Records from [place]: 09/16/2013: TSH 1.260 Labs from previous ObGyn (Dr Arie Sabina) not sent.  [facility] (Dr. Hilma Favors): 01/24/2012: patient was on Garnette Scheuermann, which was refilled. 01/24/2013: patient desired a new OCP due to increased weight  of 10 pounds since starting [place] 2 years prior. This was despite being very active, and dancing. Patient  was prescribed Yaz. 08/14/2013: stopped taking Yaz in May 2014, no period since then. OB/GYN exam normal. At that point, patient was weighing 159.2 pounds, BMI 25.31.  LH 9.5, LH 7.8 (off OCP for 7 months). Testosterone was not checked.  2. High DHEAS  3. Low TSH  PLAN: 1.  - Patient with several clinical features of PCOS: Irregular menstrual cycles - then amenorrhea; initially weight gain -but weight loss before last visit; acne - improved on metformin.  She is now on the extended release of metformin which she takes with dinner.  She previously had GI symptoms with the regular formulation.  She tried multiple OCPs in the past and they caused weight gain so she wanted to avoid them.  At last visit she was telling me that OB/GYN suggested Provera as she did not have a menstrual cycle for a full year, but she did not start this.  At that time, she lost 24 pounds by eating only when she was hungry but also training for a marathon which she actually ran in 07/2017.  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  We discussed that she will need to have a positive energy balance to get her menstrual cycles back.  I did suggest to start Provera at that time.  I also explained that the chronic lack of estrogen can cause osteoporosis - she will need day DXA scan. -However, at this visit, she tells me that after our last visit, she started to gain weight and she ended up gaining approximately 13 pounds, the 150 pounds.  In 03/2018, she started to have spontaneous menses and they are now regular.  She continues to run and  she actually run another marathon in 11/2018.  However, she continues to have monthly cycles.  This is excellent news, so at this point, we most likely do not need further intervention or a bone density scan. -We will continue metformin  ER 1000 mg with dinner - I will see her back in 1 year  2. High DHEAS -Most likely in connection with PCOS and exercise -this is decreasing, normal at last check -No further investigation needed for this  3. Low TSH  -Normalized per last check -Asymptomatic -We will recheck when patient returns to the clinic  Carlus Pavlov, MD PhD [facility] Endocrinology

## 2019-10-16 ENCOUNTER — Encounter: Payer: Self-pay | Admitting: Internal Medicine

## 2019-11-13 ENCOUNTER — Ambulatory Visit: Payer: BLUE CROSS/BLUE SHIELD | Admitting: Internal Medicine

## 2019-11-14 ENCOUNTER — Ambulatory Visit: Payer: BLUE CROSS/BLUE SHIELD | Admitting: Internal Medicine

## 2020-08-23 ENCOUNTER — Ambulatory Visit: Payer: Self-pay | Admitting: Internal Medicine
